# Patient Record
Sex: Female | Born: 1955 | Race: White | Hispanic: No | Marital: Married | Smoking: Never smoker
Health system: Southern US, Community
[De-identification: ages and names within clinical notes are randomized; demographics above are authoritative.]

## PROBLEM LIST (undated history)

## (undated) DIAGNOSIS — G35 Multiple sclerosis: Secondary | ICD-10-CM

## (undated) DIAGNOSIS — N289 Disorder of kidney and ureter, unspecified: Secondary | ICD-10-CM

## (undated) DIAGNOSIS — N200 Calculus of kidney: Secondary | ICD-10-CM

## (undated) HISTORY — PX: ABDOMINAL HYSTERECTOMY: SHX81

---

## 2008-09-06 ENCOUNTER — Encounter: Admission: RE | Admit: 2008-09-06 | Discharge: 2008-09-06 | Payer: Self-pay | Admitting: Unknown Physician Specialty

## 2011-08-26 ENCOUNTER — Inpatient Hospital Stay (INDEPENDENT_AMBULATORY_CARE_PROVIDER_SITE_OTHER)
Admission: RE | Admit: 2011-08-26 | Discharge: 2011-08-26 | Disposition: A | Discharge status: Home / Self Care | Payer: BC Managed Care – PPO | Source: Ambulatory Visit | Attending: Emergency Medicine | Admitting: Emergency Medicine

## 2011-08-26 ENCOUNTER — Encounter: Payer: Self-pay | Admitting: Emergency Medicine

## 2011-08-26 DIAGNOSIS — IMO0002 Reserved for concepts with insufficient information to code with codable children: Secondary | ICD-10-CM | POA: Insufficient documentation

## 2011-10-08 NOTE — Progress Notes (Signed)
Summary: shingles vaccine react./TM   Vital Signs:  Patient Profile:   55 Years Old Female CC:      REACTION TO SHINGLE VACCINE Height:     66 inches Weight:      175.50 pounds O2 Sat:      98 % O2 treatment:    Room Air Temp:     98.8 degrees F oral Pulse rate:   81 / minute Resp:     20 per minute BP sitting:   121 / 81  (right arm) Cuff size:   regular  Vitals Entered By: Linton Flemings RN (August 26, 2011 1:33 PM)                  Updated Prior Medication List: DOXYCYCLINE HYCLATE 100 MG CAPS (DOXYCYCLINE HYCLATE) 1 by mouth two times a day for 8 days  Current Allergies: ! CIPRO ! AUGMENTIN ! FLAGYLHistory of Present Illness Chief Complaint: REACTION TO SHINGLE VACCINE History of Present Illness: She had the shingles vaccine a few days ago. Last night developed redness and swelling on L arm where she got the shot.  Today feels fatigue, chills, body aches, flu like.  She feels the redness and swelling is getting worse.  REVIEW OF SYSTEMS Constitutional Symptoms       Complains of fever and chills.     Denies night sweats, weight loss, weight gain, and fatigue.  Eyes       Denies change in vision, eye pain, eye discharge, glasses, contact lenses, and eye surgery. Ear/Nose/Throat/Mouth       Denies hearing loss/aids, change in hearing, ear pain, ear discharge, dizziness, frequent runny nose, frequent nose bleeds, sinus problems, sore throat, hoarseness, and tooth pain or bleeding.  Respiratory       Denies dry cough, productive cough, wheezing, shortness of breath, asthma, bronchitis, and emphysema/COPD.  Cardiovascular       Denies murmurs, chest pain, and tires easily with exhertion.    Gastrointestinal       Complains of nausea/vomiting.      Denies stomach pain, diarrhea, constipation, blood in bowel movements, and indigestion. Genitourniary       Denies painful urination, kidney stones, and loss of urinary control. Neurological       Complains of headaches.       Denies paralysis, seizures, and fainting/blackouts. Musculoskeletal       Complains of muscle pain, redness, and swelling.      Denies joint pain, joint stiffness, decreased range of motion, muscle weakness, and gout.  Skin       Denies bruising, unusual mles/lumps or sores, and hair/skin or nail changes.  Psych       Denies mood changes, temper/anger issues, anxiety/stress, speech problems, depression, and sleep problems.  Past History:  Past Medical History: MS  Past Surgical History: Appendectomy Hysterectomy TUBAL PREGNANCY KNEE  Family History: LUNG CA- PARENTS MS- SISTER HTN- SISTER  Social History: SMOKE- NO ALCOHOL- NO DRUG USE- NO Physical Exam General appearance: well developed, well nourished, no acute distress MSE: oriented to time, place, and person L dorsal forearm has 6cm erythema and a central area of induration that is tender to palpation, no axillary LAD. Assessment New Problems: CELLULITIS, ARM (ICD-682.3)   Plan New Medications/Changes: DOXYCYCLINE HYCLATE 100 MG CAPS (DOXYCYCLINE HYCLATE) 1 by mouth two times a day for 8 days  #16 x 0, 08/26/2011, Hoyt Koch MD  New Orders: New Patient Level II [phone] Planning Comments:   Could just be a  localized reaction to the shot, however it does appear to be a cellulitis.  Will treat with Doxy for the next week.  Cool compresses to decrease inflammation today.  If not improving in a few days (or if getting worse in the meantime, she needs to call her PCP or go to ER).   The patient and/or caregiver has been counseled thoroughly with regard to medications prescribed including dosage, schedule, interactions, rationale for use, and possible side effects and they verbalize understanding.  Diagnoses and expected course of recovery discussed and will return if not improved as expected or if the condition worsens. Patient and/or caregiver verbalized understanding.  Prescriptions: DOXYCYCLINE HYCLATE  100 MG CAPS (DOXYCYCLINE HYCLATE) 1 by mouth two times a day for 8 days  #16 x 0   Entered and Authorized by:   Hoyt Koch MD   Signed by:   Hoyt Koch MD on 08/26/2011   Method used:   Print then Give to Patient   RxID:   [id number]   Orders Added: 1)  New Patient Level II [84132]

## 2011-12-13 ENCOUNTER — Other Ambulatory Visit: Payer: Self-pay | Admitting: Unknown Physician Specialty

## 2011-12-13 ENCOUNTER — Ambulatory Visit
Admission: RE | Admit: 2011-12-13 | Discharge: 2011-12-13 | Disposition: A | Discharge status: Home / Self Care | Payer: 59 | Source: Ambulatory Visit | Attending: Unknown Physician Specialty | Admitting: Unknown Physician Specialty

## 2011-12-13 DIAGNOSIS — R05 Cough: Secondary | ICD-10-CM

## 2012-04-07 ENCOUNTER — Ambulatory Visit
Admission: RE | Admit: 2012-04-07 | Discharge: 2012-04-07 | Disposition: A | Discharge status: Home / Self Care | Payer: 59 | Source: Ambulatory Visit | Attending: Family Medicine | Admitting: Family Medicine

## 2012-04-07 ENCOUNTER — Other Ambulatory Visit: Payer: Self-pay | Admitting: Family Medicine

## 2012-04-07 DIAGNOSIS — R52 Pain, unspecified: Secondary | ICD-10-CM

## 2014-04-04 ENCOUNTER — Encounter: Payer: Self-pay | Admitting: Emergency Medicine

## 2014-04-04 ENCOUNTER — Emergency Department
Admission: EM | Admit: 2014-04-04 | Discharge: 2014-04-04 | Disposition: A | Discharge status: Home / Self Care | Payer: 59 | Source: Home / Self Care | Attending: Emergency Medicine | Admitting: Emergency Medicine

## 2014-04-04 DIAGNOSIS — G35 Multiple sclerosis: Secondary | ICD-10-CM | POA: Insufficient documentation

## 2014-04-04 DIAGNOSIS — J069 Acute upper respiratory infection, unspecified: Secondary | ICD-10-CM

## 2014-04-04 HISTORY — DX: Multiple sclerosis: G35

## 2014-04-04 MED ORDER — AMOXICILLIN 875 MG PO TABS: 875.0000 mg | ORAL_TABLET | Freq: Two times a day (BID) | ORAL | Status: DC

## 2014-04-04 MED ORDER — FLUTICASONE PROPIONATE 50 MCG/ACT NA SUSP: 2.0000 | Freq: Every day | NASAL | Status: DC

## 2014-04-04 NOTE — ED Notes (Signed)
Deanna Daugherty complains of left side jaw pain, left ear pain and neck pain for 2 days. She states she is unable to lay down flat due to the pain. She rates the pain as a 9/10 on the pain scale. Denies fever, chills or sweats.

## 2014-04-04 NOTE — ED Provider Notes (Signed)
CSN: [id number]     Arrival date & time 04/04/14  1104 History   First MD Initiated Contact with Patient 04/04/14 1107     Chief Complaint  Patient presents with  . Jaw Pain  . Otalgia  . Neck Pain   (Consider location/radiation/quality/duration/timing/severity/associated sxs/prior Treatment) HPI Deanna Daugherty is a 58 y.o. female who complains of onset of jaw & face pain and URI symptoms for 2 days.  The symptoms are constant and moderate in severity.  Not using any meds.  Hurts worse in certain positions such as laying down. No sore throat No cough No pleuritic pain No wheezing No nasal congestion No post-nasal drainage No sinus pain/pressure No chest congestion No itchy/red eyes + L earache + L face & jaw pain No hemoptysis No SOB No chills/sweats No fever No nausea No vomiting No abdominal pain No diarrhea No skin rashes No fatigue No myalgias No headache     Past Medical History  Diagnosis Date  . MS (multiple sclerosis)    Past Surgical History  Procedure Laterality Date  . Abdominal hysterectomy     History reviewed. No pertinent family history. History  Substance Use Topics  . Smoking status: Never Smoker   . Smokeless tobacco: Never Used  . Alcohol Use: No   OB History   Grav Para Term Preterm Abortions TAB SAB Ect Mult Living                 Review of Systems  All other systems reviewed and are negative.   Allergies  Amoxicillin-pot clavulanate; Ceclor; Ciprofloxacin; and Metronidazole  Home Medications   Prior to Admission medications   Medication Sig Start Date End Date Taking? Authorizing Provider  acetaminophen (TYLENOL) 500 MG tablet Take 500 mg by mouth every 6 (six) hours as needed.   Yes Historical Provider, MD  amoxicillin (AMOXIL) 875 MG tablet Take 1 tablet (875 mg total) by mouth 2 (two) times daily. 04/04/14   Marlaine Hind, MD  fluticasone (FLONASE) 50 MCG/ACT nasal spray Place 2 sprays into both nostrils daily. 04/04/14    Marlaine Hind, MD   BP 117/76  Pulse 82  Ht 5\' 6"  (1.676 m)  Wt 187 lb (84.823 kg)  BMI 30.20 kg/m2  SpO2 98% Physical Exam  Nursing note and vitals reviewed. Constitutional: She is oriented to person, place, and time. She appears well-developed and well-nourished.  Non-toxic appearance. She does not appear ill.  HENT:  Head: Normocephalic and atraumatic.  Right Ear: Tympanic membrane, external ear and ear canal normal.  Left Ear: Tympanic membrane, external ear and ear canal normal.  Nose: Mucosal edema present.  Mouth/Throat: No oropharyngeal exudate, posterior oropharyngeal edema or posterior oropharyngeal erythema.  Mild left-sided submental and submandibular lymphadenopathy, mildly tender.  No parotid swelling.  No pain over the TMJ.  Mild tenderness of the left maxillary sinus  Eyes: No scleral icterus.  Neck: Neck supple.  Cardiovascular: Regular rhythm and normal heart sounds.   Pulmonary/Chest: Effort normal and breath sounds normal. No respiratory distress. She has no decreased breath sounds. She has no wheezes.  Neurological: She is alert and oriented to person, place, and time.  Skin: Skin is warm and dry.  Psychiatric: She has a normal mood and affect. Her speech is normal.    ED Course  Procedures (including critical care time) Labs Review Labs Reviewed - No data to display  Imaging Review No results found.   MDM   1. Acute upper respiratory infections of unspecified  site    1)  Take the prescribed antibiotic as instructed.  Patient likely with sinus infection.  However differential diagnosis also includes parotitis so if not improving, should try Clinda, etc to cover. Can try sour candies in the meantime.  Should get on scheduled Aleve or Advil to help with pain, especially before bed. 2)  Use nasal saline solution (over the counter) at least 3 times a day. 3)  Use over the counter decongestants like Zyrtec-D every 12 hours as needed to help with  congestion.  If you have hypertension, do not take medicines with sudafed.  4)  Can take tylenol every 6 hours or motrin every 8 hours for pain or fever. 5)  Follow up with your primary doctor if no improvement in 5-7 days, sooner if increasing pain, fever, or new symptoms.     Marlaine HindJeffrey H Darrion Wyszynski, MD 04/04/14 [phone]

## 2015-06-04 ENCOUNTER — Encounter: Payer: Self-pay | Admitting: Emergency Medicine

## 2015-06-04 ENCOUNTER — Emergency Department
Admission: EM | Admit: 2015-06-04 | Discharge: 2015-06-04 | Disposition: A | Discharge status: Home / Self Care | Payer: 59 | Source: Home / Self Care | Attending: Family Medicine | Admitting: Family Medicine

## 2015-06-04 ENCOUNTER — Emergency Department (INDEPENDENT_AMBULATORY_CARE_PROVIDER_SITE_OTHER): Payer: 59

## 2015-06-04 DIAGNOSIS — Z9109 Other allergy status, other than to drugs and biological substances: Secondary | ICD-10-CM

## 2015-06-04 DIAGNOSIS — Z91048 Other nonmedicinal substance allergy status: Secondary | ICD-10-CM

## 2015-06-04 DIAGNOSIS — R062 Wheezing: Secondary | ICD-10-CM | POA: Diagnosis not present

## 2015-06-04 DIAGNOSIS — R05 Cough: Secondary | ICD-10-CM

## 2015-06-04 DIAGNOSIS — J209 Acute bronchitis, unspecified: Secondary | ICD-10-CM

## 2015-06-04 MED ORDER — BENZONATATE 100 MG PO CAPS: 100.0000 mg | ORAL_CAPSULE | Freq: Three times a day (TID) | ORAL | Status: DC

## 2015-06-04 MED ORDER — AZITHROMYCIN 250 MG PO TABS: 250.0000 mg | ORAL_TABLET | Freq: Every day | ORAL | Status: DC

## 2015-06-04 MED ORDER — PREDNISONE 20 MG PO TABS: ORAL_TABLET | ORAL | Status: DC

## 2015-06-04 MED ORDER — DM-GUAIFENESIN ER 30-600 MG PO TB12: 1.0000 | ORAL_TABLET | Freq: Two times a day (BID) | ORAL | Status: DC

## 2015-06-04 NOTE — ED Provider Notes (Signed)
CSN: [id number]     Arrival date & time 06/04/15  1311 History   First MD Initiated Contact with Patient 06/04/15 1335     Chief Complaint  Patient presents with  . Cough   (Consider location/radiation/quality/duration/timing/severity/associated sxs/prior Treatment) HPI The patient is a 59 year old female with concern for possible pneumonia due to gradually worsening rattling in her chest with associated productive cough, mild intermittent shortness of breath and chest congestion for the last 3 days.  Patient states she gets recurrent sinus infections and stays congested but this is worse than her normal.  Patient states she did have some Augmentin left over from a sinus infection several months ago and started taking it, but is concerned if she does have an infection, she will need additional antibiotics.  She also reports mild diffuse body aches.  Patient states she believes she is allergic to her daughter's dog as it sheds a lot.  Patient states she has been taking Claritin with minimal relief.  She has used Flonase in the past but states she only gets minimal relief with this.  Patient states she does have family doctor but cannot afford a specialist such as a ENT or allergist.  Denies prior history of asthma.  Denies fevers, chills, nausea, vomiting or diarrhea.  Patient states she has had prednisone in the past for similar symptoms.  Patient does have an albuterol inhaler from prior chest congestion episodes.  Patient has never smoked cigarettes in the past.  Past Medical History  Diagnosis Date  . MS (multiple sclerosis)    Past Surgical History  Procedure Laterality Date  . Abdominal hysterectomy     No family history on file. History  Substance Use Topics  . Smoking status: Never Smoker   . Smokeless tobacco: Never Used  . Alcohol Use: No   OB History    No data available     Review of Systems  Constitutional: Negative for fever and chills.  HENT: Positive for congestion, ear  pain, rhinorrhea, sinus pressure, sneezing and sore throat. Negative for trouble swallowing and voice change.   Respiratory: Positive for cough, chest tightness, shortness of breath and wheezing. Negative for stridor.   Cardiovascular: Negative for chest pain and palpitations.  Gastrointestinal: Positive for nausea. Negative for vomiting, abdominal pain and diarrhea.  Genitourinary: Negative for dysuria, urgency, hematuria and flank pain.  Musculoskeletal: Positive for myalgias and arthralgias.  Neurological: Positive for dizziness. Negative for facial asymmetry, light-headedness and headaches.  All other systems reviewed and are negative.   Allergies  Amoxicillin-pot clavulanate; Ceclor; Ciprofloxacin; and Metronidazole  Home Medications   Prior to Admission medications   Medication Sig Start Date End Date Taking? Authorizing Provider  acetaminophen (TYLENOL) 500 MG tablet Take 500 mg by mouth every 6 (six) hours as needed.    Historical Provider, MD  amoxicillin (AMOXIL) 875 MG tablet Take 1 tablet (875 mg total) by mouth 2 (two) times daily. 04/04/14   Marlaine Hind, MD  azithromycin (ZITHROMAX) 250 MG tablet Take 1 tablet (250 mg total) by mouth daily. Take first 2 tablets together, then 1 every day until finished. 06/04/15   Junius Finner, PA-C  benzonatate (TESSALON) 100 MG capsule Take 1 capsule (100 mg total) by mouth every 8 (eight) hours. 06/04/15   Junius Finner, PA-C  dextromethorphan-guaiFENesin (MUCINEX DM) 30-600 MG per 12 hr tablet Take 1 tablet by mouth 2 (two) times daily. 06/04/15   Junius Finner, PA-C  fluticasone (FLONASE) 50 MCG/ACT nasal spray Place 2 sprays  into both nostrils daily. 04/04/14   Marlaine Hind, MD  predniSONE (DELTASONE) 20 MG tablet 3 tabs po day one, then 2 po daily x 4 days 06/04/15   Junius Finner, PA-C   BP 132/85 mmHg  Pulse 82  Temp(Src) 98.2 F (36.8 C) (Oral)  Ht 5\' 6"  (1.676 m)  Wt 180 lb (81.647 kg)  BMI 29.07 kg/m2  SpO2  98% Physical Exam  Constitutional: She appears well-developed and well-nourished. No distress.  HENT:  Head: Normocephalic and atraumatic.  Right Ear: External ear normal.  Left Ear: External ear normal.  Nose: Mucosal edema present.  Mouth/Throat: Uvula is midline, oropharynx is clear and moist and mucous membranes are normal. No oropharyngeal exudate.  Eyes: Conjunctivae are normal. No scleral icterus.  Neck: Normal range of motion. Neck supple.  Cardiovascular: Normal rate, regular rhythm and normal heart sounds.   Pulmonary/Chest: Effort normal. No respiratory distress. She has no wheezes. She has rales. She exhibits no tenderness.  Intermittent productive cough. No respiratory distress, able to speak in full sentences w/o difficulty.   Abdominal: Soft. Bowel sounds are normal. She exhibits no distension and no mass. There is no tenderness. There is no rebound and no guarding.  Musculoskeletal: Normal range of motion.  Neurological: She is alert.  Skin: Skin is warm and dry. She is not diaphoretic.  Nursing note and vitals reviewed.   ED Course  Procedures (including critical care time) Labs Review Labs Reviewed - No data to display  Imaging Review Dg Chest 2 View  06/04/2015   CLINICAL DATA:  Productive cough, wheezing, and chest congestion for 2 weeks. Sinusitis. Multiple sclerosis.  EXAM: CHEST  2 VIEW  COMPARISON:  12/13/2011  FINDINGS: The heart size and mediastinal contours are within normal limits. Both lungs are clear. No evidence of pneumothorax or pleural effusion.  IMPRESSION: Stable exam.  No active cardiopulmonary disease.   Electronically Signed   By: Myles Rosenthal M.D.   On: 06/04/2015 13:55     MDM   1. Acute bronchitis, unspecified organism   2. Environmental allergies    Patient is 59 year old female with history of recurrent sinus infections.  Patient is concerned for pneumonia.  Patient does have rhonchi and productive cough on exam.  Chest x-ray negative  for pneumonia, however due to severity of cough and complaint of intermittent shortness of breath, will start on 5 day course of prednisone and azithromycin.  Also prescribed Tessalon Perles and Mucinex for cough relief.  Strongly encouraged lots of rest and fluids.  Follow-up with PCP next week if symptoms not improving, sooner if worsening. Patient verbalized understanding and agreement with treatment plan.    Junius Finner, PA-C 06/04/15 1424

## 2015-06-04 NOTE — ED Notes (Signed)
Pt c/o possible pneumonia or lung infection that started on Wednesday.  Pt had left over Augmentin from a sinus infection that she started on Wednesday and is somewhat better.  Non-productive cough, some SOB, chest congestion.

## 2015-09-18 ENCOUNTER — Encounter: Payer: Self-pay | Admitting: Emergency Medicine

## 2015-09-18 ENCOUNTER — Emergency Department
Admission: EM | Admit: 2015-09-18 | Discharge: 2015-09-18 | Disposition: A | Discharge status: Home / Self Care | Payer: Medicare Other | Source: Home / Self Care | Attending: Family Medicine | Admitting: Family Medicine

## 2015-09-18 DIAGNOSIS — J069 Acute upper respiratory infection, unspecified: Secondary | ICD-10-CM

## 2015-09-18 DIAGNOSIS — B9789 Other viral agents as the cause of diseases classified elsewhere: Principal | ICD-10-CM

## 2015-09-18 MED ORDER — PREDNISONE 20 MG PO TABS: 20.0000 mg | ORAL_TABLET | Freq: Two times a day (BID) | ORAL | Status: DC

## 2015-09-18 MED ORDER — DOXYCYCLINE HYCLATE 100 MG PO CAPS: 100.0000 mg | ORAL_CAPSULE | Freq: Two times a day (BID) | ORAL | Status: DC

## 2015-09-18 MED ORDER — BENZONATATE 200 MG PO CAPS: 200.0000 mg | ORAL_CAPSULE | Freq: Every day | ORAL | Status: DC

## 2015-09-18 NOTE — ED Provider Notes (Signed)
CSN: [id number]     Arrival date & time 09/18/15  1143 History   First MD Initiated Contact with Patient 09/18/15 1146     Chief Complaint  Patient presents with  . Cough  . Hoarse  . Generalized Body Aches      HPI Comments: Patient complains of four day history of typical cold-like symptoms including sinus congestion, headache, fatigue, and cough.   The history is provided by the patient.    Past Medical History  Diagnosis Date  . MS (multiple sclerosis) [facility])    Past Surgical History  Procedure Laterality Date  . Abdominal hysterectomy     History reviewed. No pertinent family history. Social History  Substance Use Topics  . Smoking status: Never Smoker   . Smokeless tobacco: Never Used  . Alcohol Use: No   OB History    No data available     Review of Systems No sore throat + hoarse + cough No pleuritic pain No wheezing + nasal congestion ? post-nasal drainage No sinus pain/pressure No itchy/red eyes No earache No hemoptysis No SOB No fever/chills No nausea No vomiting No abdominal pain No diarrhea No urinary symptoms No skin rash + fatigue + myalgias + headache Used OTC meds without relief  Allergies  Amoxicillin-pot clavulanate; Ceclor; Ciprofloxacin; and Metronidazole  Home Medications   Prior to Admission medications   Medication Sig Start Date End Date Taking? Authorizing Provider  acetaminophen (TYLENOL) 500 MG tablet Take 500 mg by mouth every 6 (six) hours as needed.    Historical Provider, MD  benzonatate (TESSALON) 200 MG capsule Take 1 capsule (200 mg total) by mouth at bedtime. Take as needed for cough 09/18/15   Lattie Haw, MD  doxycycline (VIBRAMYCIN) 100 MG capsule Take 1 capsule (100 mg total) by mouth 2 (two) times daily. Take with food. 09/18/15   Lattie Haw, MD  fluticasone (FLONASE) 50 MCG/ACT nasal spray Place 2 sprays into both nostrils daily. 04/04/14   Marlaine Hind, MD  predniSONE (DELTASONE) 20 MG tablet  Take 1 tablet (20 mg total) by mouth 2 (two) times daily. Take with food. 09/18/15   Lattie Haw, MD   Meds Ordered and Administered this Visit  Medications - No data to display  BP 113/72 mmHg  Pulse 88  Temp(Src) 99.3 F (37.4 C) (Oral)  Resp 16  Ht  (1.676 m)  Wt 170 lb (77.111 kg)  BMI 27.45 kg/m2  SpO2 96% No data found.   Physical Exam Nursing notes and Vital Signs reviewed. Appearance:  Patient appears stated age, and in no acute distress Eyes:  Pupils are equal, round, and reactive to light and accomodation.  Extraocular movement is intact.  Conjunctivae are not inflamed  Ears:  Canals normal.  Tympanic membranes normal.  Nose:  Mildly congested turbinates.  No sinus tenderness.   Pharynx:  Normal Neck:  Supple.   Tender enlarged posterior nodes are palpated bilaterally  Lungs:   Scattered rhonchi.  Breath sounds are equal.  Moving air well. Heart:  Regular rate and rhythm without murmurs, rubs, or gallops.  Abdomen:  Nontender without masses or hepatosplenomegaly.  Bowel sounds are present.  No CVA or flank tenderness.  Extremities:  No edema.  No calf tenderness Skin:  No rash present.   ED Course  Procedures  none  MDM   1. Viral URI with cough    Begin doxycycline for atypical coverage.  Prednisone burst.  Prescription written for Benzonatate (Tessalon)  to take at bedtime for night-time cough.  Take plain guaifenesin (  extended release tabs such as Mucinex) twice daily, with plenty of water, for cough and congestion.  May add Pseudoephedrine ( , one or two every 4 to 6 hours) for sinus congestion.  Get adequate rest.   May use Afrin nasal spray (or generic oxymetazoline) twice daily for about 5 days and then discontinue.  Also recommend using saline nasal spray several times daily and saline nasal irrigation (AYR is a common brand).   Try warm salt water gargles for sore throat.  Stop all antihistamines for now, and other non-prescription  cough/cold preparations. Continue albuterol inhaler as needed.  Follow-up with family doctor if not improving about 7 to 10 days.     Lattie Haw, MD 09/24/15 [phone]

## 2015-09-18 NOTE — Discharge Instructions (Signed)
Take plain guaifenesin (  extended release tabs such as Mucinex) twice daily, with plenty of water, for cough and congestion.  May add Pseudoephedrine ( , one or two every 4 to 6 hours) for sinus congestion.  Get adequate rest.   May use Afrin nasal spray (or generic oxymetazoline) twice daily for about 5 days and then discontinue.  Also recommend using saline nasal spray several times daily and saline nasal irrigation (AYR is a common brand).   Try warm salt water gargles for sore throat.  Stop all antihistamines for now, and other non-prescription cough/cold preparations. Continue albuterol inhaler as needed.  Follow-up with family doctor if not improving about 7 to 10 days.

## 2015-09-18 NOTE — ED Notes (Signed)
Reports 4 days of cough that produces greenish sputum, congestion, ache. Took ibuprofen at 0800 today.

## 2016-01-21 ENCOUNTER — Encounter: Payer: Self-pay | Admitting: Emergency Medicine

## 2016-01-21 ENCOUNTER — Emergency Department
Admission: EM | Admit: 2016-01-21 | Discharge: 2016-01-21 | Disposition: A | Discharge status: Home / Self Care | Payer: Medicare Other | Source: Home / Self Care | Attending: Family Medicine | Admitting: Family Medicine

## 2016-01-21 ENCOUNTER — Emergency Department (INDEPENDENT_AMBULATORY_CARE_PROVIDER_SITE_OTHER): Payer: Medicare Other

## 2016-01-21 DIAGNOSIS — R0602 Shortness of breath: Secondary | ICD-10-CM | POA: Diagnosis not present

## 2016-01-21 DIAGNOSIS — J111 Influenza due to unidentified influenza virus with other respiratory manifestations: Secondary | ICD-10-CM

## 2016-01-21 DIAGNOSIS — R69 Illness, unspecified: Principal | ICD-10-CM

## 2016-01-21 MED ORDER — OSELTAMIVIR PHOSPHATE 75 MG PO CAPS: 75.0000 mg | ORAL_CAPSULE | Freq: Two times a day (BID) | ORAL | Status: DC

## 2016-01-21 MED ORDER — IBUPROFEN 800 MG PO TABS
800.0000 mg | ORAL_TABLET | Freq: Once | ORAL | Status: AC
  Administered 2016-01-21: 800 mg via ORAL

## 2016-01-21 MED ORDER — PREDNISONE 20 MG PO TABS: 20.0000 mg | ORAL_TABLET | Freq: Two times a day (BID) | ORAL | Status: DC

## 2016-01-21 MED ORDER — BENZONATATE 200 MG PO CAPS: 200.0000 mg | ORAL_CAPSULE | Freq: Every day | ORAL | Status: DC

## 2016-01-21 NOTE — ED Notes (Signed)
Reports few days of nasal congestion; yesterday began running fever, had aches and chills, took mucinex with tylenol 3 hours ago.

## 2016-01-21 NOTE — Discharge Instructions (Signed)
Take plain guaifenesin (  extended release tabs such as Mucinex) twice daily, with plenty of water, for cough and congestion.  May add Pseudoephedrine ( , one or two every 4 to 6 hours) for sinus congestion.  Get adequate rest.   May use Afrin nasal spray (or generic oxymetazoline) twice daily for about 5 days and then discontinue.  Also recommend using saline nasal spray several times daily and saline nasal irrigation (AYR is a common brand).  Try warm salt water gargles for sore throat.  Stop all antihistamines for now, and other non-prescription cough/cold preparations. Continue inhaler as needed. Follow-up with family doctor if not improving about 6 days.Marland Kitchen

## 2016-01-21 NOTE — ED Provider Notes (Signed)
CSN: [id number]     Arrival date & time 01/21/16  1506 History   First MD Initiated Contact with Patient 01/21/16 1701     Chief Complaint  Patient presents with  . Cough  . Fever  . Generalized Body Aches  . Shortness of Breath      HPI Comments: Patient reports that she had mild nasal congestion for about a week but did not feel ill.   Yesterday she suddenly developed fever to 100.5, and today had increased cough, headache, myalgias, and increased sinus congestion. She has a past history of exercise asthma, and has had pneumonia in the past.  The history is provided by the patient.    Past Medical History  Diagnosis Date  . MS (multiple sclerosis) [facility])    Past Surgical History  Procedure Laterality Date  . Abdominal hysterectomy     History reviewed. No pertinent family history. Social History  Substance Use Topics  . Smoking status: Never Smoker   . Smokeless tobacco: Never Used  . Alcohol Use: No   OB History    No data available     Review of Systems + sore throat + cough + sneezing No pleuritic pain + wheezing + nasal congestion + post-nasal drainage No sinus pain/pressure No itchy/red eyes ? earache No hemoptysis + SOB + fever, + chills No nausea No vomiting No abdominal pain No diarrhea No urinary symptoms No skin rash + fatigue + myalgias + headache Used OTC meds without relief  Allergies  Amoxicillin-pot clavulanate; Ceclor; Ciprofloxacin; and Metronidazole  Home Medications   Prior to Admission medications   Medication Sig Start Date End Date Taking? Authorizing Provider  acetaminophen (TYLENOL) 500 MG tablet Take 500 mg by mouth every 6 (six) hours as needed.    Historical Provider, MD  benzonatate (TESSALON) 200 MG capsule Take 1 capsule (200 mg total) by mouth at bedtime. Take as needed for cough 01/21/16   Lattie Haw, MD  doxycycline (VIBRAMYCIN) 100 MG capsule Take 1 capsule (100 mg total) by mouth 2 (two) times daily. Take  with food. 09/18/15   Lattie Haw, MD  fluticasone (FLONASE) 50 MCG/ACT nasal spray Place 2 sprays into both nostrils daily. 04/04/14   Marlaine Hind, MD  oseltamivir (TAMIFLU) 75 MG capsule Take 1 capsule (75 mg total) by mouth every 12 (twelve) hours. 01/21/16   Lattie Haw, MD  predniSONE (DELTASONE) 20 MG tablet Take 1 tablet (20 mg total) by mouth 2 (two) times daily. Take with food. 01/21/16   Lattie Haw, MD   Meds Ordered and Administered this Visit   Medications  ibuprofen (ADVIL,MOTRIN) tablet 800 mg (800 mg Oral Given 01/21/16 1612)    BP 104/69 mmHg  Pulse 102  Temp(Src) 101.5 F (38.6 C) (Oral)  Resp 18  Ht 5\' 6"  (1.676 m)  Wt 178 lb (80.74 kg)  BMI 28.74 kg/m2  SpO2 96% No data found.   Physical Exam Nursing notes and Vital Signs reviewed. Appearance:  Patient appears stated age, and in no acute distress Eyes:  Pupils are equal, round, and reactive to light and accomodation.  Extraocular movement is intact.  Conjunctivae are not inflamed  Ears:  Canals normal.  Tympanic membranes normal.  Nose: Congested turbinates.  No sinus tenderness.   Pharynx:  Erythematous uvula  Neck:  Supple.  Tender enlarged posterior nodes are palpated bilaterally  Lungs:  Clear to auscultation.  Breath sounds are equal.  Moving air well. Heart:  Regular  rate and rhythm without murmurs, rubs, or gallops.  Abdomen:  Nontender without masses or hepatosplenomegaly.  Bowel sounds are present.  No CVA or flank tenderness.  Extremities:  No edema.  Skin:  No rash present.   ED Course  Procedures none  Imaging Review Dg Chest 2 View  01/21/2016  CLINICAL DATA:  Congestion for 1 week.  Shortness of breath. EXAM: CHEST  2 VIEW COMPARISON:  June 04, 2015 FINDINGS: The heart size and mediastinal contours are within normal limits. Both lungs are clear. The visualized skeletal structures are unremarkable. IMPRESSION: No active cardiopulmonary disease. Electronically Signed   By: Gerome Sam III M.D   On: 01/21/2016 17:39      MDM   1. Influenza-like illness    Begin Tamiflu, and prednisone burst. Prescription written for Benzonatate (Tessalon) to take at bedtime for night-time cough.  Take plain guaifenesin (  extended release tabs such as Mucinex) twice daily, with plenty of water, for cough and congestion.  May add Pseudoephedrine ( , one or two every 4 to 6 hours) for sinus congestion.  Get adequate rest.   May use Afrin nasal spray (or generic oxymetazoline) twice daily for about 5 days and then discontinue.  Also recommend using saline nasal spray several times daily and saline nasal irrigation (AYR is a common brand).  Try warm salt water gargles for sore throat.  Stop all antihistamines for now, and other non-prescription cough/cold preparations. Continue inhaler as needed. Follow-up with family doctor if not improving about 6 days.Lattie Haw, MD 01/24/16 2204

## 2017-11-11 IMAGING — CR DG CHEST 2V
2 series · 2 of 2 positions shown · non-contrast
Comparison: June 04, 2015

CLINICAL DATA: Congestion for 1 week.  Shortness of breath.

EXAM:
CHEST  2 VIEW

[chest pa]
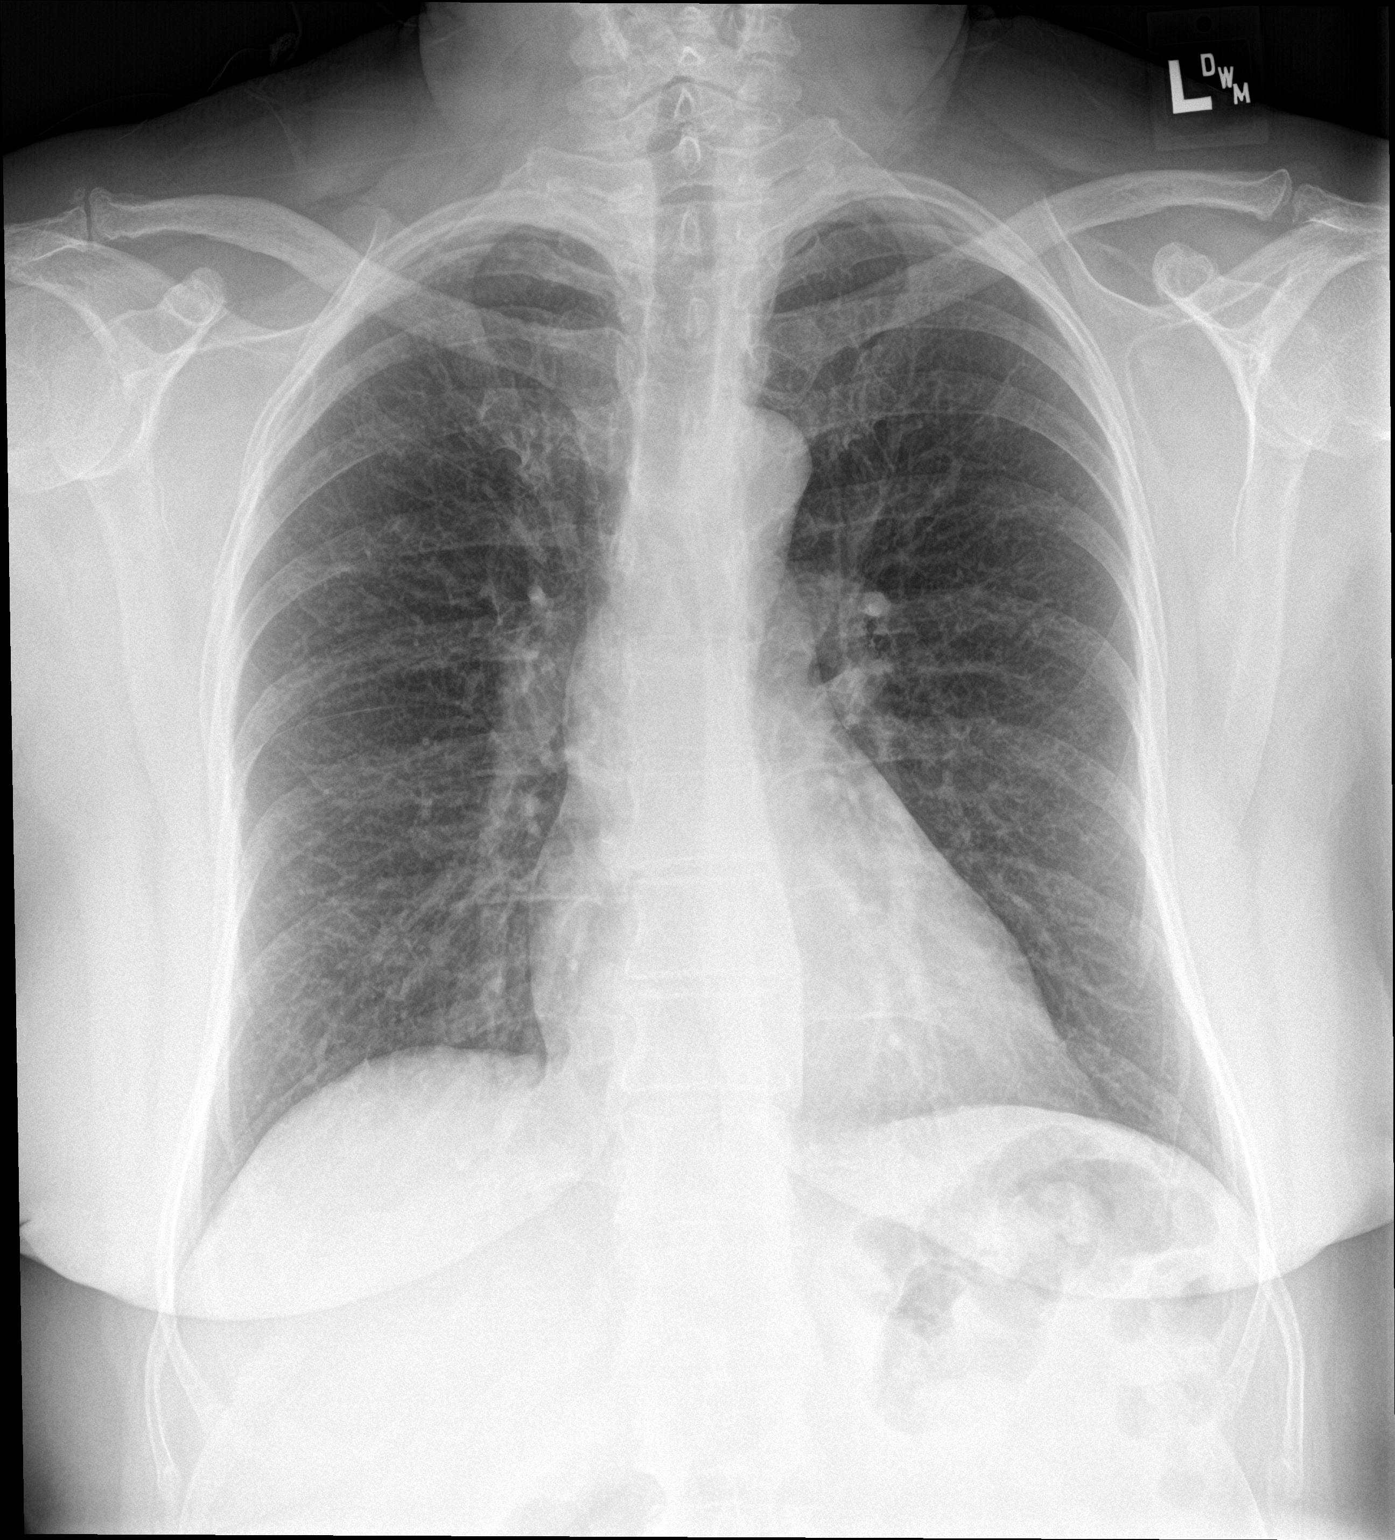

[chest lat]
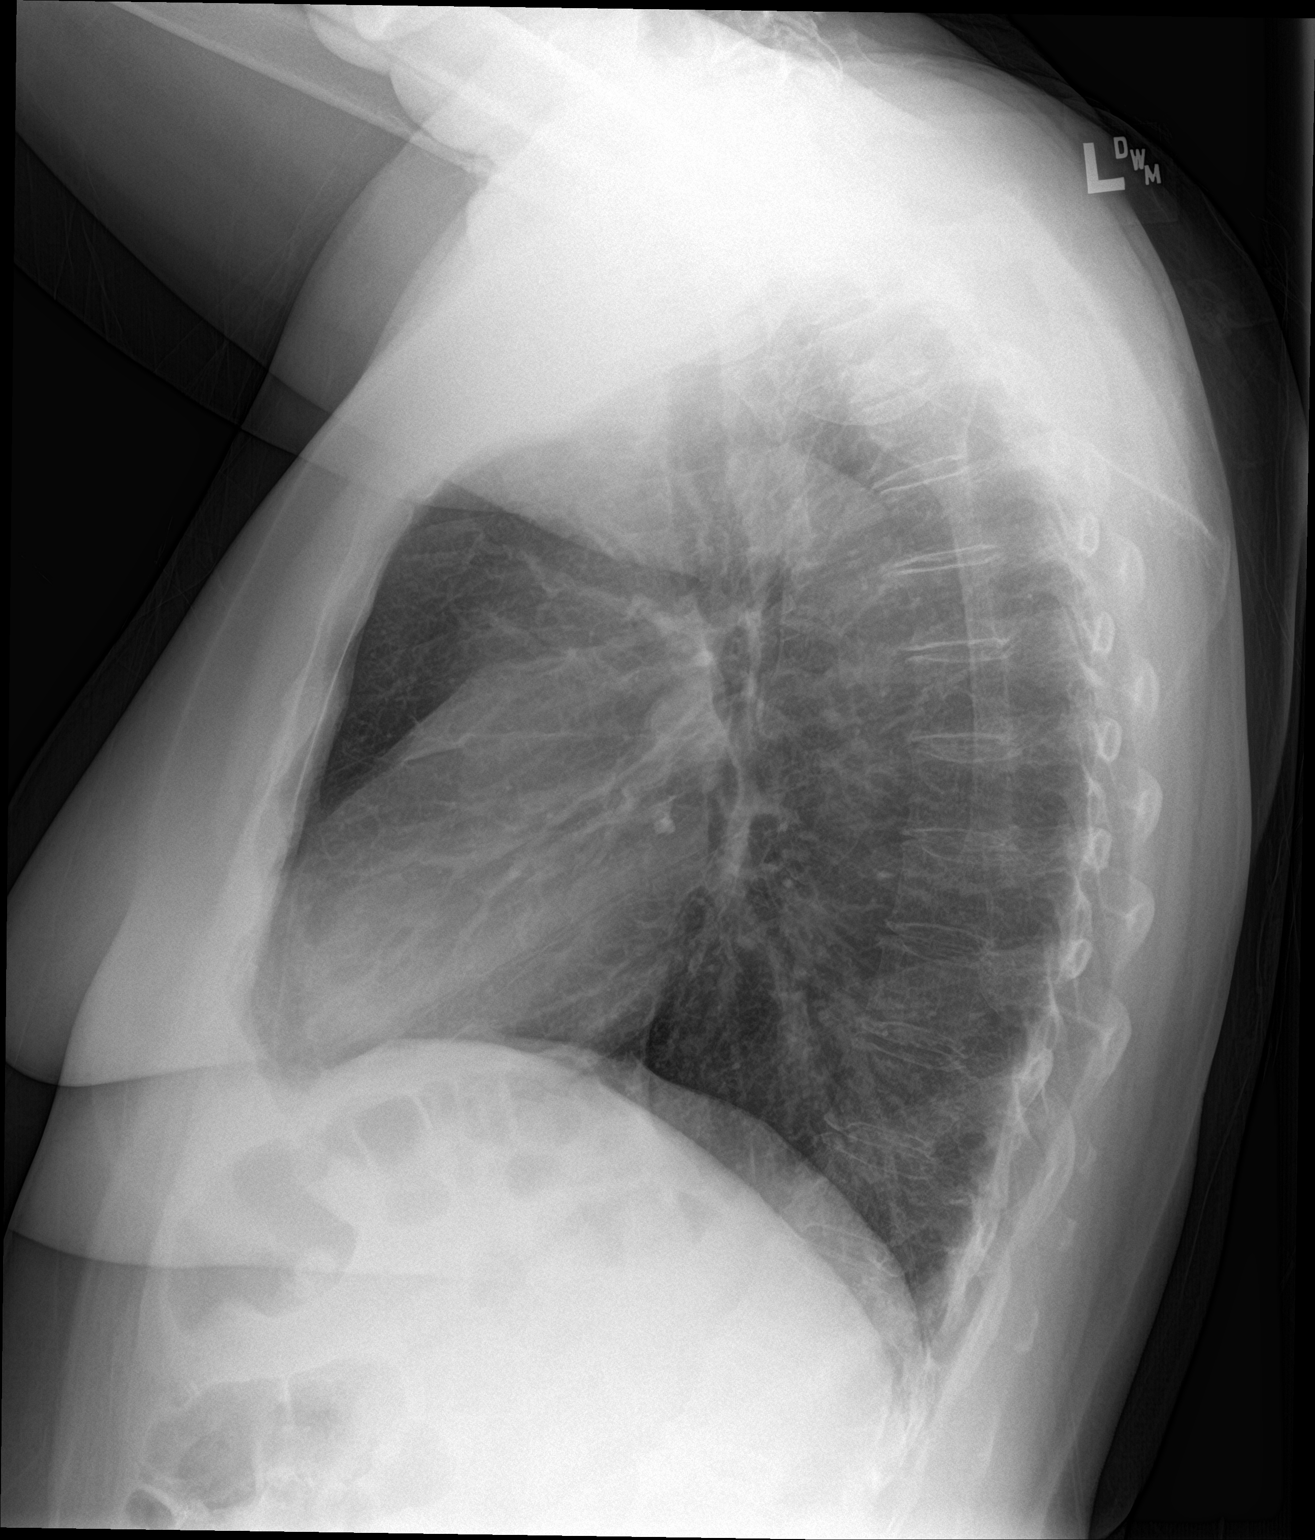

[2 of 2 positions shown; findings below may reference images not displayed]

FINDINGS: The heart size and mediastinal contours are within normal limits.
Both lungs are clear. The visualized skeletal structures are
unremarkable.
IMPRESSION: No active cardiopulmonary disease.

## 2018-05-18 ENCOUNTER — Encounter: Payer: Self-pay | Admitting: Emergency Medicine

## 2018-05-18 ENCOUNTER — Emergency Department
Admission: EM | Admit: 2018-05-18 | Discharge: 2018-05-18 | Disposition: A | Discharge status: Home / Self Care | Payer: Medicare Other | Source: Home / Self Care

## 2018-05-18 DIAGNOSIS — J0120 Acute ethmoidal sinusitis, unspecified: Secondary | ICD-10-CM

## 2018-05-18 MED ORDER — DOXYCYCLINE HYCLATE 100 MG PO CAPS
100.0000 mg | ORAL_CAPSULE | Freq: Two times a day (BID) | ORAL | 0 refills | Status: DC
Start: 2018-05-18 — End: 2020-07-30

## 2018-05-18 NOTE — Discharge Instructions (Signed)
Return if any problems.

## 2018-05-18 NOTE — ED Triage Notes (Signed)
Patient presents to [facility] with C/O nasal congestion, headache, bilateral ear pain cough productive at times, greenish /yellow. Symptoms times 7 days

## 2018-05-19 NOTE — ED Provider Notes (Signed)
[facility]    CSN: [id number] Arrival date & time: 05/18/18  1103     History   Chief Complaint Chief Complaint  Patient presents with  . Facial Pain  . Nasal Congestion  . Cough  . Otalgia    HPI Deanna Daugherty is a 62 y.o. female.   The history is provided by the patient. No language interpreter was used.  Cough  Cough characteristics:  Productive Sputum characteristics:  Nondescript Severity:  Moderate Onset quality:  Gradual Timing:  Constant Progression:  Worsening Chronicity:  Recurrent Smoker: no   Relieved by:  Nothing Worsened by:  Nothing Ineffective treatments:  None tried Associated symptoms: ear pain   Otalgia  Associated symptoms: cough   Pt complains of sinus pain and pressure.  Past Medical History:  Diagnosis Date  . MS (multiple sclerosis) [facility])     Patient Active Problem List   Diagnosis Date Noted  . MS (multiple sclerosis) (HCC)   . CELLULITIS, ARM 08/26/2011    Past Surgical History:  Procedure Laterality Date  . ABDOMINAL HYSTERECTOMY      OB History   None      Home Medications    Prior to Admission medications   Medication Sig Start Date End Date Taking? Authorizing Provider  acetaminophen (TYLENOL) 500 MG tablet Take 500 mg by mouth every 6 (six) hours as needed.    [provider]  doxycycline (VIBRAMYCIN) 100 MG capsule Take 1 capsule (100 mg total) by mouth 2 (two) times daily. 05/18/18   Elson Areas, PA-C    Family History History reviewed. No pertinent family history.  Social History Social History   Tobacco Use  . Smoking status: Never Smoker  . Smokeless tobacco: Never Used  Substance Use Topics  . Alcohol use: No  . Drug use: No     Allergies   Amoxicillin-pot clavulanate; Ceclor [cefaclor]; Ciprofloxacin; and Metronidazole   Review of Systems Review of Systems  HENT: Positive for ear pain.   Respiratory: Positive for cough.   All other systems reviewed and are  negative.    Physical Exam Triage Vital Signs ED Triage Vitals  Enc Vitals Group     BP 05/18/18 1133 114/77     Pulse Rate 05/18/18 1133 84     Resp 05/18/18 1133 16     Temp 05/18/18 1133 98.6 F (37 C)     Temp Source 05/18/18 1133 Oral     SpO2 05/18/18 1133 98 %     Weight 05/18/18 1134 185 lb 4 oz (84 kg)     Height 05/18/18 1134 5\' 6"  (1.676 m)     Head Circumference --      Peak Flow --      Pain Score 05/18/18 1134 (S) 5     Pain Loc --      Pain Edu? --      Excl. in GC? --    No data found.  Updated Vital Signs BP 114/77 (BP Location: Right Arm)   Pulse 84   Temp 98.6 F (37 C) (Oral)   Resp 16   Ht 5\' 6"  (1.676 m)   Wt 185 lb 4 oz (84 kg)   SpO2 98%   BMI 29.90 kg/m   Visual Acuity Right Eye Distance:   Left Eye Distance:   Bilateral Distance:    Right Eye Near:   Left Eye Near:    Bilateral Near:     Physical Exam  Constitutional: She is  oriented to person, place, and time. She appears well-developed and well-nourished.  HENT:  Head: Normocephalic.  Right Ear: External ear normal.  Left Ear: External ear normal.  Tender maxillary sinuses,   Eyes: EOM are normal.  Neck: Normal range of motion.  Cardiovascular: Normal rate and regular rhythm.  Pulmonary/Chest: Effort normal.  Abdominal: She exhibits no distension.  Musculoskeletal: Normal range of motion.  Neurological: She is alert and oriented to person, place, and time.  Psychiatric: She has a normal mood and affect.  Nursing note and vitals reviewed.    UC Treatments / Results  Labs (all labs ordered are listed, but only abnormal results are displayed) Labs Reviewed - No data to display  EKG None  Radiology No results found.  Procedures Procedures (including critical care time)  Medications Ordered in UC Medications - No data to display  Initial Impression / Assessment and Plan / UC Course  I have reviewed the triage vital signs and the nursing notes.  Pertinent labs  & imaging results that were available during my care of the patient were reviewed by me and considered in my medical decision making (see chart for details).     MDM  Pt started on antibiotics.  Pt advised to follow up with her MD for recheck  Final Clinical Impressions(s) / UC Diagnoses   Final diagnoses:  Acute ethmoidal sinusitis, recurrence not specified     Discharge Instructions     Return if any problems.    ED Prescriptions    Medication Sig Dispense Auth. Provider   doxycycline (VIBRAMYCIN) 100 MG capsule Take 1 capsule (100 mg total) by mouth 2 (two) times daily. 20 capsule Elson Areas, [place]     Controlled Substance Prescriptions [place] Controlled Substance Registry consulted? Not Applicable  An After Visit Summary was printed and given to the patient.    Elson Areas, [place] 05/19/18 [phone]

## 2020-07-30 ENCOUNTER — Other Ambulatory Visit: Payer: Self-pay

## 2020-07-30 ENCOUNTER — Emergency Department
Admission: EM | Admit: 2020-07-30 | Discharge: 2020-07-30 | Disposition: A | Discharge status: Home / Self Care | Payer: Medicare Other | Source: Home / Self Care | Attending: Family Medicine | Admitting: Family Medicine

## 2020-07-30 ENCOUNTER — Emergency Department (INDEPENDENT_AMBULATORY_CARE_PROVIDER_SITE_OTHER): Payer: Medicare Other

## 2020-07-30 DIAGNOSIS — J329 Chronic sinusitis, unspecified: Secondary | ICD-10-CM

## 2020-07-30 DIAGNOSIS — J0101 Acute recurrent maxillary sinusitis: Secondary | ICD-10-CM

## 2020-07-30 DIAGNOSIS — J0111 Acute recurrent frontal sinusitis: Secondary | ICD-10-CM

## 2020-07-30 MED ORDER — PREDNISONE 20 MG PO TABS: ORAL_TABLET | ORAL | 0 refills | Status: DC

## 2020-07-30 MED ORDER — AMOXICILLIN-POT CLAVULANATE 875-125 MG PO TABS: ORAL_TABLET | ORAL | 0 refills | Status: DC

## 2020-07-30 NOTE — Discharge Instructions (Addendum)
Take Pseudoephedrine (30mg , one or two every 4 to 6 hours) for sinus congestion.  Increase fluid intake. May use Afrin nasal spray (or generic oxymetazoline) each morning for about 5 days and then discontinue.  Also recommend using saline nasal spray several times daily and saline nasal irrigation (AYR is a common brand).  Use Flonase nasal spray each morning after using Afrin nasal spray and saline nasal irrigation. Try warm salt water gargles for sore throat.

## 2020-07-30 NOTE — ED Triage Notes (Signed)
x13 days. Pt states that she has some sinus drainage, ears feel full, cough from sinus drainage. Pt states that she always has sinus infections around this time of year. Pt states that she is having some yellow phlegm. Pt states that she has some facial pain as well. Pt states that she is vaccinated.

## 2020-07-30 NOTE — ED Provider Notes (Signed)
[facility]    CSN: [id number] Arrival date & time: 07/30/20  1056      History   Chief Complaint Chief Complaint  Patient presents with  . Facial Pain    HPI Deanna Daugherty is a 64 y.o. female.   Thirteen days ago patient developed sinus congestion that has persisted.  She has developed constant facial pain and ears feel full.  She has post-nasal drainage and a mild cough.  She denies fevers, chills, and sweats.  She states that she has a history of recurrent sinusitis this time of year.  The history is provided by the patient.    Past Medical History:  Diagnosis Date  . MS (multiple sclerosis) [facility])     Patient Active Problem List   Diagnosis Date Noted  . MS (multiple sclerosis) (HCC)   . CELLULITIS, ARM 08/26/2011    Past Surgical History:  Procedure Laterality Date  . ABDOMINAL HYSTERECTOMY      OB History   No obstetric history on file.      Home Medications    Prior to Admission medications   Medication Sig Start Date End Date Taking? Authorizing Provider  acetaminophen (TYLENOL) 500 MG tablet Take 500 mg by mouth every 6 (six) hours as needed.   Yes [provider]  aspirin 81 MG chewable tablet Chew by mouth.   Yes [provider]  amoxicillin-clavulanate (AUGMENTIN) 875-125 MG tablet Take one tab PO Q12hr 07/30/20   Lattie Haw, MD  predniSONE (DELTASONE) 20 MG tablet Take one tab by mouth twice daily for 4 days, then one daily for 3 days. Take with food. 07/30/20   Lattie Haw, MD    Family History Family History  Problem Relation Age of Onset  . Cancer Mother   . Cancer Father   . Hypertension Sister   . Heart defect Sister     Social History Social History   Tobacco Use  . Smoking status: Never Smoker  . Smokeless tobacco: Never Used  Substance Use Topics  . Alcohol use: No  . Drug use: No     Allergies   Ceclor [cefaclor], Ciprofloxacin, and Metronidazole   Review of Systems Review of  Systems No sore throat + cough No pleuritic pain No wheezing + nasal congestion + post-nasal drainage + sinus pain/pressure No itchy/red eyes ? earache No hemoptysis No SOB No fever/chills No nausea No vomiting No abdominal pain No diarrhea No urinary symptoms No skin rash + fatigue No myalgias + headache    Physical Exam Triage Vital Signs ED Triage Vitals  Enc Vitals Group     BP 07/30/20 1118 124/82     Pulse Rate 07/30/20 1118 76     Resp --      Temp 07/30/20 1118 99.1 F (37.3 C)     Temp Source 07/30/20 1118 Oral     SpO2 07/30/20 1118 97 %     Weight 07/30/20 1114 185 lb (83.9 kg)     Height 07/30/20 1114 5\' 5"  (1.651 m)     Head Circumference --      Peak Flow --      Pain Score 07/30/20 1113 8     Pain Loc --      Pain Edu? --      Excl. in GC? --    No data found.  Updated Vital Signs BP 124/82 (BP Location: Left Arm)   Pulse 76   Temp 99.1 F (37.3 C) (Oral)  Ht 5\' 5"  (1.651 m)   Wt 83.9 kg   SpO2 97%   BMI 30.79 kg/m   Visual Acuity Right Eye Distance:   Left Eye Distance:   Bilateral Distance:    Right Eye Near:   Left Eye Near:    Bilateral Near:     Physical Exam Nursing notes and Vital Signs reviewed. Appearance:  Patient appears stated age, and in no acute distress Eyes:  Pupils are equal, round, and reactive to light and accomodation.  Extraocular movement is intact.  Conjunctivae are not inflamed  Ears:  Canals normal.  Tympanic membranes normal.  Nose:  Congested turbinates.  Maxillary and frontal sinus tenderness is present.  Pharynx:  Normal Neck:  Supple.  No adenopathy.  Lungs:  Clear to auscultation.  Breath sounds are equal.  Moving air well. Heart:  Regular rate and rhythm without murmurs, rubs, or gallops.  Abdomen:  Nontender without masses or hepatosplenomegaly.  Bowel sounds are present.  No CVA or flank tenderness.  Extremities:  No edema.  Skin:  No rash present.   UC Treatments / Results  Labs (all  labs ordered are listed, but only abnormal results are displayed) Labs Reviewed - No data to display  EKG   Radiology DG Sinuses Complete  Result Date: 07/30/2020 CLINICAL DATA:  Chronic sinus congestion and recurrent sinusitis. EXAM: PARANASAL SINUSES - COMPLETE 3 + VIEW COMPARISON:  None. FINDINGS: The paranasal sinus are aerated. There is no evidence of sinus opacification air-fluid levels or mucosal thickening. No significant bone abnormalities are seen. IMPRESSION: Negative. Electronically Signed   By: 08/01/2020 M.D.   On: 07/30/2020 12:49    Procedures Procedures (including critical care time)  Medications Ordered in UC Medications - No data to display  Initial Impression / Assessment and Plan / UC Course  I have reviewed the triage vital signs and the nursing notes.  Pertinent labs & imaging results that were available during my care of the patient were reviewed by me and considered in my medical decision making (see chart for details).    Begin prednisone burst/taper and Augmentin. Followup with Family Doctor if not improved in about 10 days.   Final Clinical Impressions(s) / UC Diagnoses   Final diagnoses:  Acute recurrent maxillary sinusitis  Acute recurrent frontal sinusitis     Discharge Instructions     Take Pseudoephedrine (30mg , one or two every 4 to 6 hours) for sinus congestion.  Increase fluid intake. May use Afrin nasal spray (or generic oxymetazoline) each morning for about 5 days and then discontinue.  Also recommend using saline nasal spray several times daily and saline nasal irrigation (AYR is a common brand).  Use Flonase nasal spray each morning after using Afrin nasal spray and saline nasal irrigation. Try warm salt water gargles for sore throat.       ED Prescriptions    Medication Sig Dispense Auth. Provider   amoxicillin-clavulanate (AUGMENTIN) 875-125 MG tablet Take one tab PO Q12hr 20 tablet 08/01/2020, MD   predniSONE  (DELTASONE) 20 MG tablet Take one tab by mouth twice daily for 4 days, then one daily for 3 days. Take with food. 11 tablet , MD        Lattie Haw, MD 08/02/20 [phone]

## 2022-05-21 IMAGING — DX DG SINUSES COMPLETE 3+V
3 series · 3 of 3 positions shown · non-contrast
Comparison: None.

CLINICAL DATA: Chronic sinus congestion and recurrent sinusitis.

EXAM:
PARANASAL SINUSES - COMPLETE 3 + VIEW

[pns waters]
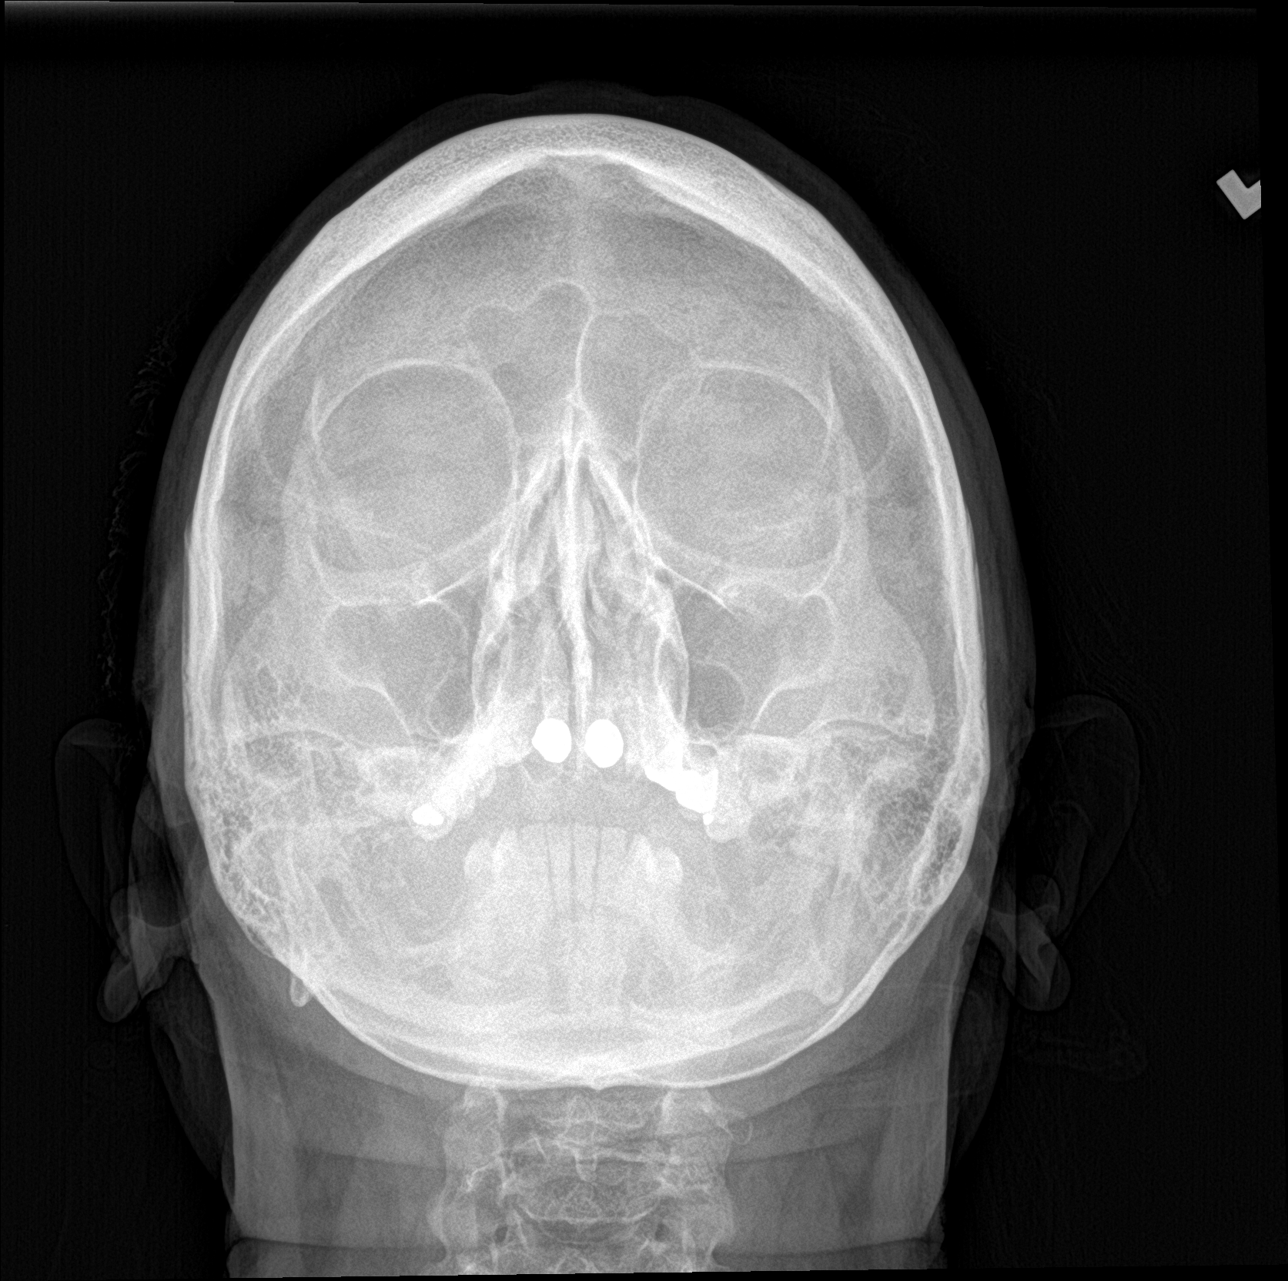

[[person_name]]
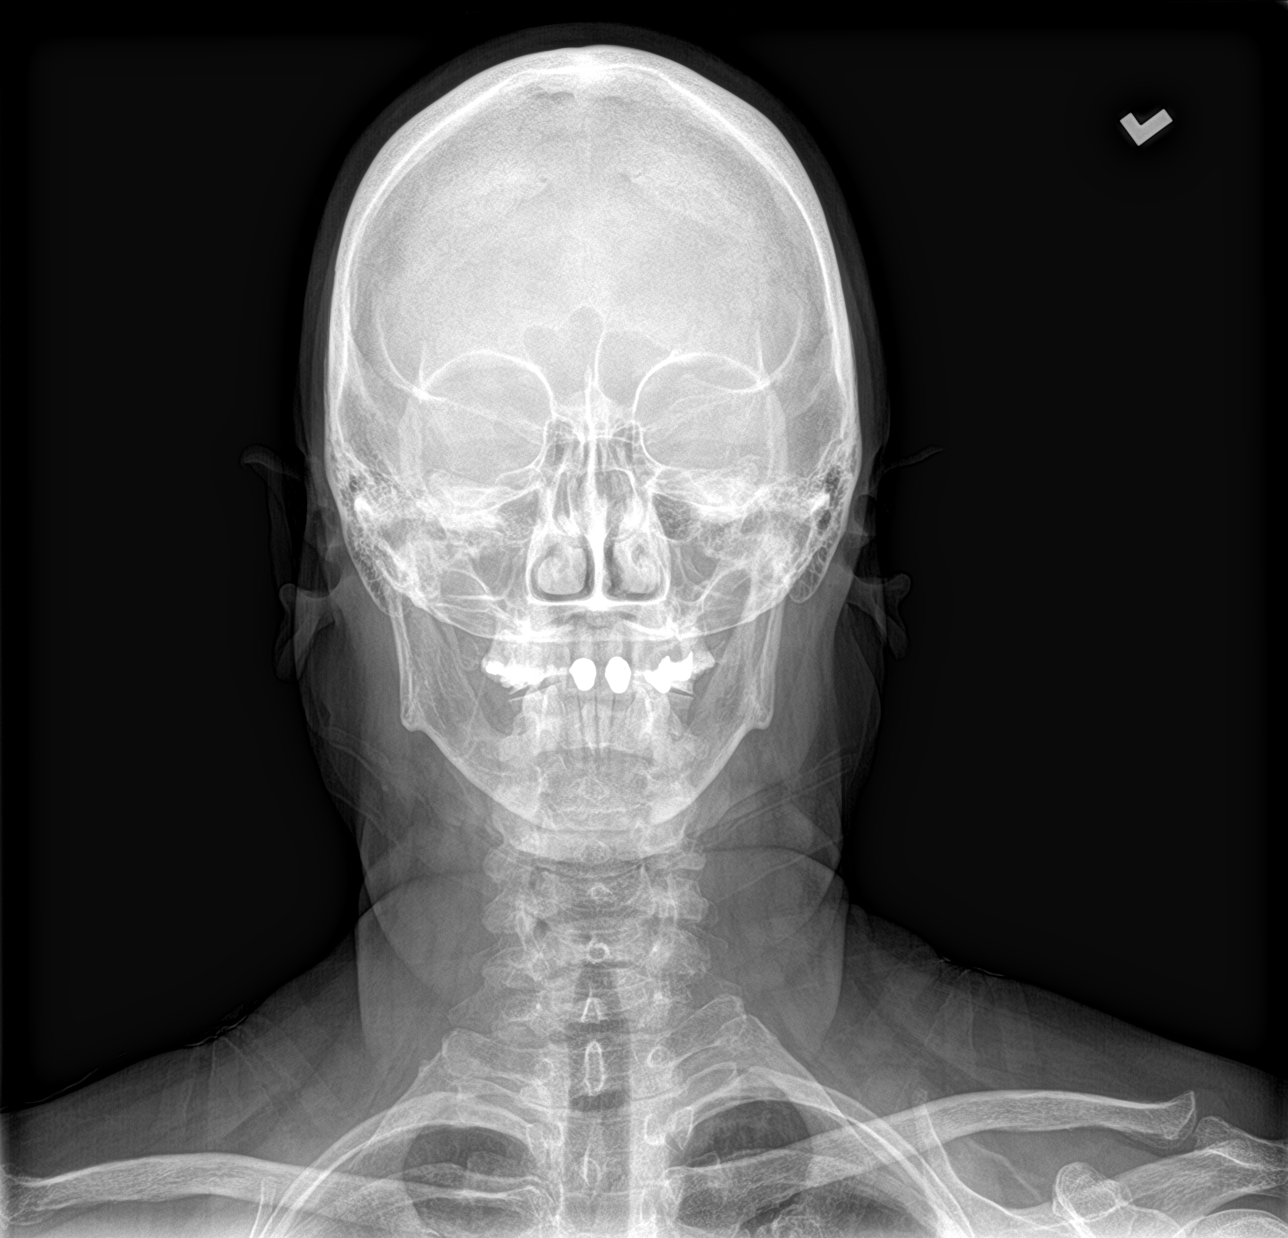

[pns lat]
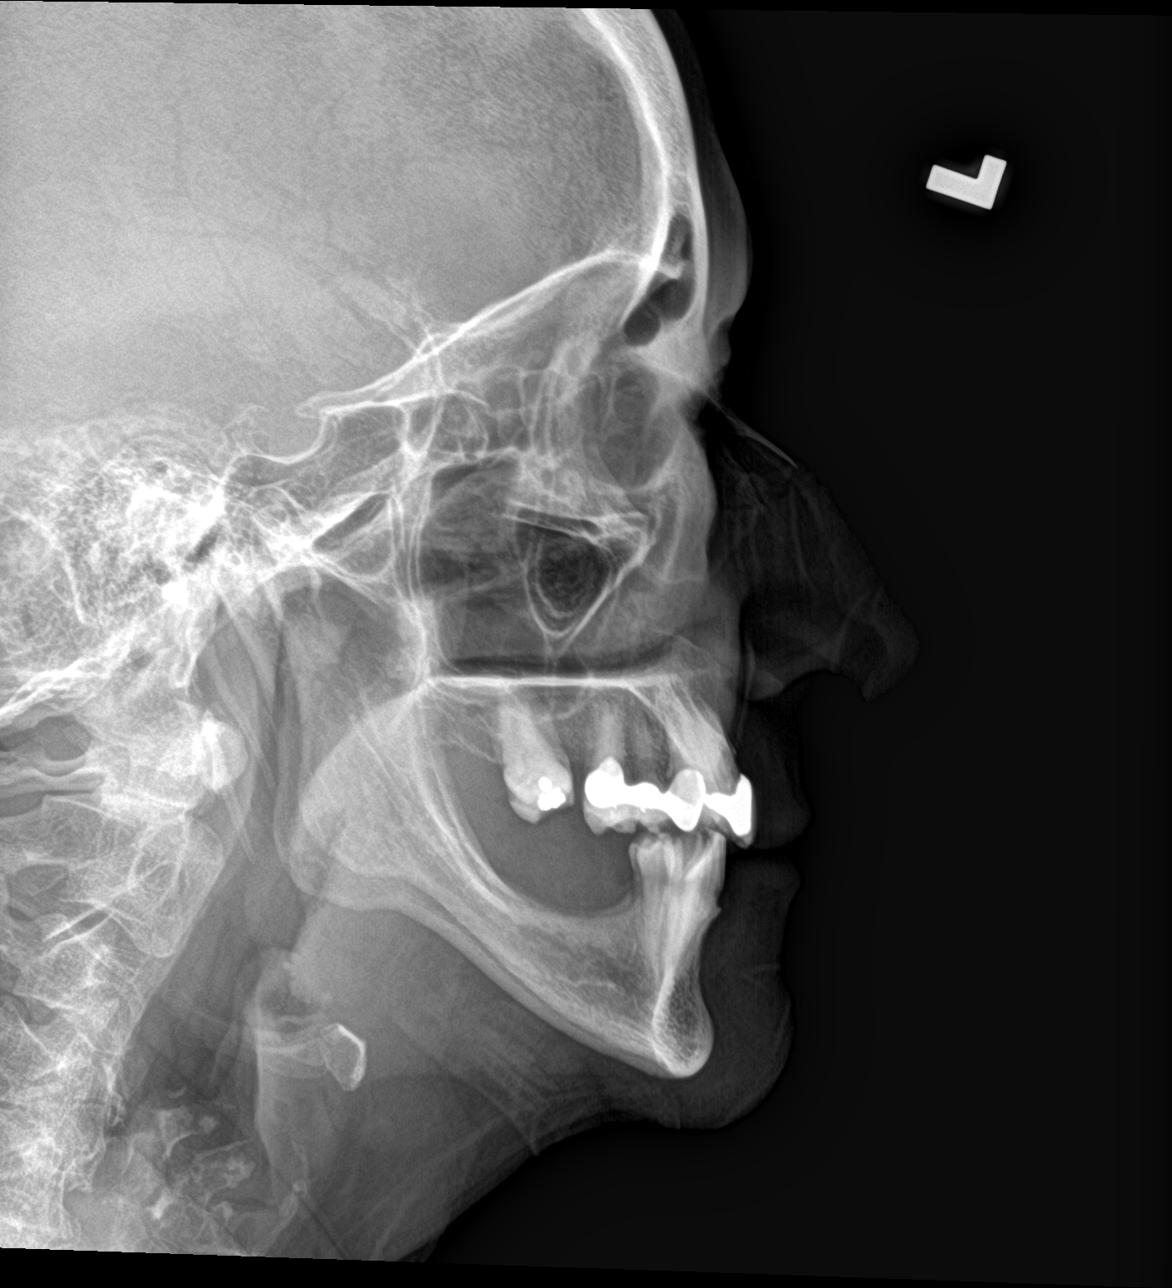

[3 of 3 positions shown; findings below may reference images not displayed]

FINDINGS: The paranasal sinus are aerated. There is no evidence of sinus
opacification air-fluid levels or mucosal thickening. No significant
bone abnormalities are seen.
IMPRESSION: Negative.

## 2022-09-22 ENCOUNTER — Other Ambulatory Visit: Payer: Self-pay

## 2022-09-22 ENCOUNTER — Ambulatory Visit
Admission: EM | Admit: 2022-09-22 | Discharge: 2022-09-22 | Disposition: A | Discharge status: Home / Self Care | Payer: Medicare Other | Attending: Family Medicine | Admitting: Family Medicine

## 2022-09-22 DIAGNOSIS — J309 Allergic rhinitis, unspecified: Secondary | ICD-10-CM

## 2022-09-22 DIAGNOSIS — J0100 Acute maxillary sinusitis, unspecified: Secondary | ICD-10-CM

## 2022-09-22 MED ORDER — FEXOFENADINE HCL 180 MG PO TABS: 180.0000 mg | ORAL_TABLET | Freq: Every day | ORAL | 0 refills | Status: DC

## 2022-09-22 MED ORDER — AMOXICILLIN-POT CLAVULANATE 875-125 MG PO TABS: 1.0000 | ORAL_TABLET | Freq: Two times a day (BID) | ORAL | 0 refills | Status: DC

## 2022-09-22 NOTE — Discharge Instructions (Addendum)
Patient to take medication as directed with food to completion.  Advised patient to take Allegra with first dose of Augmentin for the next 5 of 7 days.  May take Allegra as needed afterwards for concurrent postnasal drainage/drip.  Encouraged patient increase daily water intake while taking these medications.

## 2022-09-22 NOTE — ED Provider Notes (Signed)
[facility]    CSN: [id number] Arrival date & time: 09/22/22  1001      History   Chief Complaint Chief Complaint  Patient presents with   Nasal Congestion   Sore Throat    HPI Deanna Daugherty is a 66 y.o. female.   HPI   pleasant 66 year old female presents with cough, runny nose and sore throat for 10+ days.  Patient reports history of sinus infections.  Past Medical History:  Diagnosis Date   MS (multiple sclerosis) [facility])     Patient Active Problem List   Diagnosis Date Noted   MS (multiple sclerosis) (HCC)    CELLULITIS, ARM 08/26/2011    Past Surgical History:  Procedure Laterality Date   ABDOMINAL HYSTERECTOMY      OB History   No obstetric history on file.      Home Medications    Prior to Admission medications   Medication Sig Start Date End Date Taking? Authorizing Provider  amoxicillin-clavulanate (AUGMENTIN) 875-125 MG tablet Take 1 tablet by mouth every 12 (twelve) hours. 09/22/22  Yes Trevor Iha, FNP  fexofenadine [facility] ALLERGY) 180 MG tablet Take 1 tablet (180 mg total) by mouth daily for 15 days. 09/22/22 10/07/22 Yes Trevor Iha, FNP  acetaminophen (TYLENOL) 500 MG tablet Take 500 mg by mouth every 6 (six) hours as needed.    [provider]  aspirin 81 MG chewable tablet Chew by mouth.    [provider]    Family History Family History  Problem Relation Age of Onset   Cancer Mother    Cancer Father    Hypertension Sister    Heart defect Sister     Social History Social History   Tobacco Use   Smoking status: Never   Smokeless tobacco: Never  Substance Use Topics   Alcohol use: No   Drug use: No     Allergies   Ceclor [cefaclor], Ciprofloxacin, and Metronidazole   Review of Systems Review of Systems   Physical Exam Triage Vital Signs ED Triage Vitals  Enc Vitals Group     BP 09/22/22 1032 134/83     Pulse Rate 09/22/22 1032 76     Resp 09/22/22 1032 18     Temp 09/22/22 1032  98.7 F (37.1 C)     Temp Source 09/22/22 1032 Oral     SpO2 09/22/22 1032 97 %     Weight --      Height --      Head Circumference --      Peak Flow --      Pain Score 09/22/22 1033 8     Pain Loc --      Pain Edu? --      Excl. in GC? --    No data found.  Updated Vital Signs BP 134/83 (BP Location: Right Arm)   Pulse 76   Temp 98.7 F (37.1 C) (Oral)   Resp 18   SpO2 97%      Physical Exam Vitals and nursing note reviewed.  Constitutional:      Appearance: Normal appearance. She is obese. She is ill-appearing.  HENT:     Head: Normocephalic and atraumatic.     Right Ear: Tympanic membrane and external ear normal.     Left Ear: Tympanic membrane and external ear normal.     Ears:     Comments: Significant eustachian tube dysfunction noted bilaterally    Mouth/Throat:     Mouth: Mucous membranes are moist.  Pharynx: Oropharynx is clear.     Comments: Moderate amount of clear drainage of posterior oropharynx noted Eyes:     Extraocular Movements: Extraocular movements intact.     Conjunctiva/sclera: Conjunctivae normal.     Pupils: Pupils are equal, round, and reactive to light.  Cardiovascular:     Rate and Rhythm: Normal rate and regular rhythm.     Pulses: Normal pulses.     Heart sounds: Normal heart sounds. No murmur heard. Pulmonary:     Effort: Pulmonary effort is normal.     Breath sounds: Normal breath sounds. No wheezing, rhonchi or rales.  Musculoskeletal:     Cervical back: Normal range of motion and neck supple.  Skin:    General: Skin is warm and dry.  Neurological:     General: No focal deficit present.     Mental Status: She is alert and oriented to person, place, and time.      UC Treatments / Results  Labs (all labs ordered are listed, but only abnormal results are displayed) Labs Reviewed - No data to display  EKG   Radiology No results found.  Procedures Procedures (including critical care time)  Medications Ordered in  UC Medications - No data to display  Initial Impression / Assessment and Plan / UC Course  I have reviewed the triage vital signs and the nursing notes.  Pertinent labs & imaging results that were available during my care of the patient were reviewed by me and considered in my medical decision making (see chart for details).     MDM: 1.  Acute maxillary sinusitis, recurrence not specified-Rx'd Augmentin (reports taking Augmentin numerous times without adverse reactions); 2.  Allergic rhinitis-Rx'd Allegra. Patient to take medication as directed with food to completion.  Advised patient to take Allegra with first dose of Augmentin for the next 5 of 7 days.  May take Allegra as needed afterwards for concurrent postnasal drainage/drip.  Encouraged patient increase daily water intake while taking these medications. Final Clinical Impressions(s) / UC Diagnoses   Final diagnoses:  Acute maxillary sinusitis, recurrence not specified  Allergic rhinitis, unspecified seasonality, unspecified trigger     Discharge Instructions      Patient to take medication as directed with food to completion.  Advised patient to take Allegra with first dose of Augmentin for the next 5 of 7 days.  May take Allegra as needed afterwards for concurrent postnasal drainage/drip.  Encouraged patient increase daily water intake while taking these medications.     ED Prescriptions     Medication Sig Dispense Auth. Provider   amoxicillin-clavulanate (AUGMENTIN) 875-125 MG tablet Take 1 tablet by mouth every 12 (twelve) hours. 14 tablet Trevor Iha, FNP   fexofenadine [facility] ALLERGY) 180 MG tablet Take 1 tablet (180 mg total) by mouth daily for 15 days. 15 tablet Trevor Iha, FNP      PDMP not reviewed this encounter.   Trevor Iha, FNP 09/22/22 1137

## 2022-09-22 NOTE — ED Triage Notes (Signed)
Pt c/o runny nose and sore throat since last Thursday. Lots of post nasal drainage. Denies fever. Taking zyrtec, flonase prn. Hx of sinus infections.

## 2023-02-21 ENCOUNTER — Ambulatory Visit
Admission: EM | Admit: 2023-02-21 | Discharge: 2023-02-21 | Disposition: A | Discharge status: Home / Self Care | Payer: Medicare Other | Attending: Family Medicine | Admitting: Family Medicine

## 2023-02-21 ENCOUNTER — Ambulatory Visit (INDEPENDENT_AMBULATORY_CARE_PROVIDER_SITE_OTHER): Payer: Medicare Other

## 2023-02-21 DIAGNOSIS — R197 Diarrhea, unspecified: Secondary | ICD-10-CM | POA: Diagnosis not present

## 2023-02-21 DIAGNOSIS — E860 Dehydration: Secondary | ICD-10-CM

## 2023-02-21 DIAGNOSIS — R1110 Vomiting, unspecified: Secondary | ICD-10-CM

## 2023-02-21 DIAGNOSIS — K219 Gastro-esophageal reflux disease without esophagitis: Secondary | ICD-10-CM

## 2023-02-21 DIAGNOSIS — K222 Esophageal obstruction: Secondary | ICD-10-CM | POA: Diagnosis not present

## 2023-02-21 NOTE — ED Triage Notes (Signed)
Pt presents with c/o watery diarrhea and emesis that occurred last night. Pt state she has hx of constipation and regurgitation.

## 2023-02-21 NOTE — ED Provider Notes (Signed)
[facility]    CSN: [id number] Arrival date & time: 02/21/23  1248      History   Chief Complaint Chief Complaint  Patient presents with   Diarrhea   Emesis    HPI Deanna Daugherty is a 67 y.o. female.   HPI  Patient is here for nausea vomiting and diarrhea.  She does not, however, have a gastroenteritis.  She has difficulty swallowing from chronic reflux and esophageal stricture.  She states that when she tries to swallow even water it comes back up.  She is having difficulty getting her pills down.  She states the last meal she ate was several days ago.  She then vomited yesterday and saw parts of undigested food from that meal.  Since yesterday has also developed watery uncontrollable diarrhea.  She states that even if she sneezes she will have fecal incontinence.  Since she is unable to keep down a lot of water, she is feeling somewhat weak.  He has frequent burping and sometimes regurgitates fluid.  She states it is "yellow bile". She has a gastroenterologist and had a stricture that was dilated in 2022 She has a primary care doctor and she saw this doctor on 02/15/2023.  She was starting to have symptoms and was advised to call her gastroenterologist at that time. Her daughter brought her to the office today.  Past Medical History:  Diagnosis Date   MS (multiple sclerosis)     Patient Active Problem List   Diagnosis Date Noted   MS (multiple sclerosis)    CELLULITIS, ARM 08/26/2011    Past Surgical History:  Procedure Laterality Date   ABDOMINAL HYSTERECTOMY      OB History   No obstetric history on file.      Home Medications    Prior to Admission medications   Medication Sig Start Date End Date Taking? Authorizing Provider  atorvastatin (LIPITOR) 10 MG tablet Take 10 mg by mouth daily.   Yes [provider]  pantoprazole (PROTONIX) 40 MG tablet Take 40 mg by mouth daily.   Yes [provider]  acetaminophen (TYLENOL) 500 MG  tablet Take 500 mg by mouth every 6 (six) hours as needed.    [provider]  aspirin 81 MG chewable tablet Chew by mouth.    [provider]    Family History Family History  Problem Relation Age of Onset   Cancer Mother    Cancer Father    Hypertension Sister    Heart defect Sister     Social History Social History   Tobacco Use   Smoking status: Never   Smokeless tobacco: Never  Substance Use Topics   Alcohol use: No   Drug use: No     Allergies   Ceclor [cefaclor], Ciprofloxacin, and Metronidazole   Review of Systems Review of Systems See HPI  Physical Exam Triage Vital Signs ED Triage Vitals  Enc Vitals Group     BP 02/21/23 1258 (!) 144/85     Pulse Rate 02/21/23 1258 (!) 114     Resp 02/21/23 1258 14     Temp 02/21/23 1258 98.7 F (37.1 C)     Temp Source 02/21/23 1258 Oral     SpO2 02/21/23 1258 96 %     Weight --      Height --      Head Circumference --      Peak Flow --      Pain Score 02/21/23 1257 0  Pain Loc --      Pain Edu? --      Excl. in GC? --    No data found.  Updated Vital Signs BP 126/74 (BP Location: Left Arm)   Pulse (!) 109   Temp 98.7 F (37.1 C) (Oral)   Resp 14   SpO2 95%       Physical Exam Constitutional:      General: She is in acute distress.     Appearance: She is well-developed and normal weight.  HENT:     Head: Normocephalic and atraumatic.     Mouth/Throat:     Mouth: Mucous membranes are dry.     Pharynx: No posterior oropharyngeal erythema.  Eyes:     Conjunctiva/sclera: Conjunctivae normal.     Pupils: Pupils are equal, round, and reactive to light.  Cardiovascular:     Rate and Rhythm: Regular rhythm. Tachycardia present.     Heart sounds: Normal heart sounds.  Pulmonary:     Effort: Pulmonary effort is normal. No respiratory distress.     Breath sounds: Normal breath sounds.  Abdominal:     General: Abdomen is protuberant. Bowel sounds are decreased. There is no  distension.     Palpations: Abdomen is soft.     Tenderness: There is no abdominal tenderness.  Musculoskeletal:        General: Normal range of motion.     Cervical back: Normal range of motion.  Skin:    General: Skin is warm and dry.  Neurological:     Mental Status: She is alert.      UC Treatments / Results  Labs (all labs ordered are listed, but only abnormal results are displayed) Labs Reviewed - No data to display  EKG   Radiology DG Abdomen 1 View  Result Date: 02/21/2023 CLINICAL DATA:  Diarrhea and vomiting.  History of constipation. EXAM: ABDOMEN - 1 VIEW COMPARISON:  None Available. FINDINGS: Scattered gas and a small amount of stool are present in the colon. No dilated loops of bowel are seen to suggest obstruction. A 2 mm calcific density projects over the lower pole of the right kidney faint calcific density projecting over the lower pole of the left kidney is favored to be from the overlying twelfth rib. No acute osseous abnormality is seen. IMPRESSION: 1. Nonobstructed bowel-gas pattern. 2. Possible punctate right renal calculus. Electronically Signed   By: Sebastian Ache M.D.   On: 02/21/2023 14:11    Procedures Procedures (including critical care time)  Medications Ordered in UC Medications - No data to display  Initial Impression / Assessment and Plan / UC Course  I have reviewed the triage vital signs and the nursing notes.  Pertinent labs & imaging results that were available during my care of the patient were reviewed by me and considered in my medical decision making (see chart for details).     The x-ray does not show obstruction or any significant abnormality.  I have called the patient's PCP who believes the patient Troy Sine in the emergency room.  I did call the gastroenterology office if they were able to accommodate her.  They also recommend emergency room, if unable to tolerate discomfort until an appointment on Monday.  Discussed with patient and  her daughter.  They were sent to the emergency room for IV fluids and management Final Clinical Impressions(s) / UC Diagnoses   Final diagnoses:  Esophageal stricture  Gastroesophageal reflux disease, unspecified whether esophagitis present  Diarrhea, unspecified type  Dehydration, mild     Discharge Instructions      Go to ER     ED Prescriptions   None    PDMP not reviewed this encounter.   Eustace Moore, MD 02/21/23 [phone]

## 2023-02-21 NOTE — ED Notes (Signed)
Patient is being discharged from the Urgent Care and sent to the Emergency Department via POV . Per Lindaann Slough MD, patient is in need of higher level of care due to Emesis and diarrhea. Patient is aware and verbalizes understanding of plan of care.  Vitals:   02/21/23 1258 02/21/23 1340  BP: (!) 144/85 126/74  Pulse: (!) 114 (!) 109  Resp: 14 14  Temp: 98.7 F (37.1 C)   SpO2: 96% 95%

## 2023-02-21 NOTE — Discharge Instructions (Signed)
Go to ER

## 2023-03-27 ENCOUNTER — Ambulatory Visit
Admission: EM | Admit: 2023-03-27 | Discharge: 2023-03-27 | Disposition: A | Discharge status: Home / Self Care | Payer: Medicare Other | Attending: Family Medicine | Admitting: Family Medicine

## 2023-03-27 ENCOUNTER — Ambulatory Visit (INDEPENDENT_AMBULATORY_CARE_PROVIDER_SITE_OTHER): Payer: Medicare Other

## 2023-03-27 DIAGNOSIS — R059 Cough, unspecified: Secondary | ICD-10-CM

## 2023-03-27 DIAGNOSIS — J40 Bronchitis, not specified as acute or chronic: Secondary | ICD-10-CM | POA: Diagnosis not present

## 2023-03-27 DIAGNOSIS — J329 Chronic sinusitis, unspecified: Secondary | ICD-10-CM | POA: Diagnosis not present

## 2023-03-27 MED ORDER — DOXYCYCLINE HYCLATE 100 MG PO CAPS: 100.0000 mg | ORAL_CAPSULE | Freq: Two times a day (BID) | ORAL | 0 refills | Status: AC

## 2023-03-27 MED ORDER — PREDNISONE 10 MG (21) PO TBPK: ORAL_TABLET | Freq: Every day | ORAL | 0 refills | Status: DC

## 2023-03-27 MED ORDER — BENZONATATE 200 MG PO CAPS: 200.0000 mg | ORAL_CAPSULE | Freq: Three times a day (TID) | ORAL | 0 refills | Status: AC | PRN

## 2023-03-27 MED ORDER — PROMETHAZINE-DM 6.25-15 MG/5ML PO SYRP: 5.0000 mL | ORAL_SOLUTION | Freq: Two times a day (BID) | ORAL | 0 refills | Status: DC | PRN

## 2023-03-27 NOTE — ED Provider Notes (Signed)
[facility]    CSN: [id number] Arrival date & time: 03/27/23  0959      History   Chief Complaint Chief Complaint  Patient presents with   Nasal Congestion   Cough    HPI Deanna Daugherty is a 67 y.o. female.   HPI was not 67 year old female presents with sinus nasal congestion and cough for 2 weeks.  PMH significant for multiple sclerosis, obesity, and HLD  Past Medical History:  Diagnosis Date   MS (multiple sclerosis) (HCC)     Patient Active Problem List   Diagnosis Date Noted   MS (multiple sclerosis) (HCC)    CELLULITIS, ARM 08/26/2011    Past Surgical History:  Procedure Laterality Date   ABDOMINAL HYSTERECTOMY      OB History   No obstetric history on file.      Home Medications    Prior to Admission medications   Medication Sig Start Date End Date Taking? Authorizing Provider  benzonatate (TESSALON) 200 MG capsule Take 1 capsule (200 mg total) by mouth 3 (three) times daily as needed for up to 7 days. 03/27/23 04/03/23 Yes Trevor Iha, FNP  doxycycline (VIBRAMYCIN) 100 MG capsule Take 1 capsule (100 mg total) by mouth 2 (two) times daily for 10 days. 03/27/23 04/06/23 Yes Trevor Iha, FNP  predniSONE (STERAPRED UNI-PAK 21 TAB) 10 MG (21) TBPK tablet Take by mouth daily. Take 6 tabs by mouth daily  for 2 days, then 5 tabs for 2 days, then 4 tabs for 2 days, then 3 tabs for 2 days, 2 tabs for 2 days, then 1 tab by mouth daily for 2 days 03/27/23  Yes Trevor Iha, FNP  promethazine-dextromethorphan (PROMETHAZINE-DM) 6.25-15 MG/5ML syrup Take 5 mLs by mouth 2 (two) times daily as needed for cough. 03/27/23  Yes Trevor Iha, FNP  acetaminophen (TYLENOL) 500 MG tablet Take 500 mg by mouth every 6 (six) hours as needed.    [provider]  aspirin 81 MG chewable tablet Chew by mouth.    [provider]  atorvastatin (LIPITOR) 10 MG tablet Take 10 mg by mouth daily.    [provider]  pantoprazole (PROTONIX) 40 MG tablet  Take 40 mg by mouth daily.    [provider]    Family History Family History  Problem Relation Age of Onset   Cancer Mother    Cancer Father    Hypertension Sister    Heart defect Sister     Social History Social History   Tobacco Use   Smoking status: Never   Smokeless tobacco: Never  Substance Use Topics   Alcohol use: No   Drug use: No     Allergies   Ceclor [cefaclor], Ciprofloxacin, and Metronidazole   Review of Systems Review of Systems  HENT:  Positive for congestion, postnasal drip and sinus pain.   Respiratory:  Positive for cough.   All other systems reviewed and are negative.    Physical Exam Triage Vital Signs ED Triage Vitals  Enc Vitals Group     BP      Pulse      Resp      Temp      Temp src      SpO2      Weight      Height      Head Circumference      Peak Flow      Pain Score      Pain Loc      Pain  Edu?      Excl. in GC?    No data found.  Updated Vital Signs BP 133/86 (BP Location: Right Arm)   Pulse 93   Temp 98.4 F (36.9 C) (Oral)   Resp 16   SpO2 95%    Physical Exam Vitals and nursing note reviewed.  Constitutional:      Appearance: Normal appearance. She is obese. She is ill-appearing.  HENT:     Head: Normocephalic and atraumatic.     Right Ear: External ear normal.     Left Ear: External ear normal.     Ears:     Comments: TM's are cloudy, retracted bilaterally; moderate eustachian tube dysfunction noted bilaterally    Nose:     Comments: Turbinates are erythematous/edematous    Mouth/Throat:     Mouth: Mucous membranes are moist.     Pharynx: Oropharynx is clear.     Comments: Significant amount of clear drainage of posterior oropharynx noted Eyes:     Conjunctiva/sclera: Conjunctivae normal.     Pupils: Pupils are equal, round, and reactive to light.  Cardiovascular:     Rate and Rhythm: Normal rate and regular rhythm.     Pulses: Normal pulses.     Heart sounds: Normal heart sounds.   Pulmonary:     Effort: Pulmonary effort is normal.     Breath sounds: Rhonchi present. No wheezing or rales.     Comments: Mild diffuse scattered rhonchi noted throughout, infrequent nonproductive cough noted on exam Musculoskeletal:        General: Normal range of motion.     Cervical back: Normal range of motion and neck supple.  Skin:    General: Skin is warm and dry.  Neurological:     General: No focal deficit present.     Mental Status: She is alert and oriented to person, place, and time. Mental status is at baseline.  Psychiatric:        Mood and Affect: Mood normal.        Behavior: Behavior normal.      UC Treatments / Results  Labs (all labs ordered are listed, but only abnormal results are displayed) Labs Reviewed - No data to display  EKG   Radiology DG Chest 2 View  Result Date: 03/27/2023 CLINICAL DATA:  Cough over the last 2 weeks. Question pneumonia. Low-grade fever. EXAM: CHEST - 2 VIEW COMPARISON:  01/21/2016 FINDINGS: Heart size is normal. Mediastinal shadows are normal. There may be central bronchial thickening but there is no infiltrate, collapse or effusion. No significant bone finding. IMPRESSION: Possible bronchitis. No consolidation or collapse. Electronically Signed   By: Paulina Fusi M.D.   On: 03/27/2023 10:50    Procedures Procedures (including critical care time)  Medications Ordered in UC Medications - No data to display  Initial Impression / Assessment and Plan / UC Course  I have reviewed the triage vital signs and the nursing notes.  Pertinent labs & imaging results that were available during my care of the patient were reviewed by me and considered in my medical decision making (see chart for details).     Imp: 1.  Sinobronchitis-1. Rx'd Doxycycline 100 mg capsule twice daily x 10 days, Sterapred Unipak (tapering from 60 mg to 10 mg over 10 days); 2.  Cough-CXR revealed above, Rx'd Tessalon perles 3 times daily, as needed,  Promethazine DM 6.25-15 mg / 5 mL: Take 5 mL twice daily for cough, as needed. Instructed patient to discontinue OTC  Mucinex as this will only increase the volume of your nasal secretions/postnasal drip.  Advised patient to take medication as directed with food to completion.  Advised patient to take prednisone with first dose of Doxycycline for the next 10 days.  Advised may use Tessalon daily or as needed for cough.  Advised may use promethazine DM at night prior to sleep for cough due to sedate of effects.  Advised patient not to use cough medications together.  Encouraged increase daily water intake to 64 ounces per day while taking these medications.  Advised if symptoms worsen and/or unresolved please follow-up with PCP or here for further evaluation.  Patient discharged home, hemodynamically stable. Final Clinical Impressions(s) / UC Diagnoses   Final diagnoses:  Sinobronchitis  Cough, unspecified type     Discharge Instructions      Instructed patient to discontinue OTC Mucinex as this will only increase the volume of your nasal secretions/postnasal drip.  Advised patient to take medication as directed with food to completion.  Advised patient to take prednisone with first dose of Doxycycline for the next 10 days.  Advised may use Tessalon daily or as needed for cough.  Advised may use promethazine DM at night prior to sleep for cough due to sedate of effects.  Advised patient not to use cough medications together.  Encouraged increase daily water intake to 64 ounces per day while taking these medications.  Advised if symptoms worsen and/or unresolved please follow-up with PCP or here for further evaluation.     ED Prescriptions     Medication Sig Dispense Auth. Provider   doxycycline (VIBRAMYCIN) 100 MG capsule Take 1 capsule (100 mg total) by mouth 2 (two) times daily for 10 days. 20 capsule Trevor Iha, FNP   predniSONE (STERAPRED UNI-PAK 21 TAB) 10 MG (21) TBPK tablet Take by  mouth daily. Take 6 tabs by mouth daily  for 2 days, then 5 tabs for 2 days, then 4 tabs for 2 days, then 3 tabs for 2 days, 2 tabs for 2 days, then 1 tab by mouth daily for 2 days 42 tablet Trevor Iha, FNP   benzonatate (TESSALON) 200 MG capsule Take 1 capsule (200 mg total) by mouth 3 (three) times daily as needed for up to 7 days. 40 capsule Trevor Iha, FNP   promethazine-dextromethorphan (PROMETHAZINE-DM) 6.25-15 MG/5ML syrup Take 5 mLs by mouth 2 (two) times daily as needed for cough. 118 mL Trevor Iha, FNP      PDMP not reviewed this encounter.   Trevor Iha, FNP 03/27/23 1123

## 2023-03-27 NOTE — ED Triage Notes (Addendum)
Pt c/o nasal congestion x 2 weeks. Cough started last night. Low grade fever. Feeling winded. Hx of sinus infections and pneumonia.  Mucinex and inhaler prn. Also takes allergy meds daily.

## 2023-03-27 NOTE — Discharge Instructions (Addendum)
Instructed patient to discontinue OTC Mucinex as this will only increase the volume of your nasal secretions/postnasal drip.  Advised patient to take medication as directed with food to completion.  Advised patient to take prednisone with first dose of Doxycycline for the next 10 days.  Advised may use Tessalon daily or as needed for cough.  Advised may use promethazine DM at night prior to sleep for cough due to sedate of effects.  Advised patient not to use cough medications together.  Encouraged increase daily water intake to 64 ounces per day while taking these medications.  Advised if symptoms worsen and/or unresolved please follow-up with PCP or here for further evaluation.

## 2024-02-10 ENCOUNTER — Other Ambulatory Visit: Payer: Self-pay

## 2024-02-10 ENCOUNTER — Telehealth: Payer: Self-pay

## 2024-02-10 ENCOUNTER — Ambulatory Visit
Admission: EM | Admit: 2024-02-10 | Discharge: 2024-02-10 | Disposition: A | Discharge status: Home / Self Care | Attending: Family Medicine | Admitting: Family Medicine

## 2024-02-10 ENCOUNTER — Ambulatory Visit

## 2024-02-10 DIAGNOSIS — M79672 Pain in left foot: Secondary | ICD-10-CM

## 2024-02-10 DIAGNOSIS — M25572 Pain in left ankle and joints of left foot: Secondary | ICD-10-CM

## 2024-02-10 DIAGNOSIS — W109XXA Fall (on) (from) unspecified stairs and steps, initial encounter: Secondary | ICD-10-CM | POA: Diagnosis not present

## 2024-02-10 MED ORDER — HYDROCODONE-ACETAMINOPHEN 5-325 MG PO TABS: 1.0000 | ORAL_TABLET | Freq: Four times a day (QID) | ORAL | 0 refills | Status: DC | PRN

## 2024-02-10 NOTE — Telephone Encounter (Signed)
 Pt notified xray results negative per Dr. Delton See

## 2024-02-10 NOTE — ED Provider Notes (Signed)
 [facility]    CSN: [id number] Arrival date & time: 02/10/24  1710      History   Chief Complaint Chief Complaint  Patient presents with   Ankle Pain   Foot Pain    HPI Deanna Daugherty is a 68 y.o. female.   Patient does have impaired strength and balance, impaired vision because of MS.  She tripped going downstairs today on slick surface because of the rain.  She states that her left foot bent back and she sat on it.  She now cannot bear weight on the foot.  It swollen.  Can hardly move her ankle.  Cannot move her toes.  Denies hitting her head.  Denies other injury    Past Medical History:  Diagnosis Date   MS (multiple sclerosis) [facility])     Patient Active Problem List   Diagnosis Date Noted   MS (multiple sclerosis) (HCC)    CELLULITIS, ARM 08/26/2011    Past Surgical History:  Procedure Laterality Date   ABDOMINAL HYSTERECTOMY      OB History   No obstetric history on file.      Home Medications    Prior to Admission medications   Medication Sig Start Date End Date Taking? Authorizing Provider  HYDROcodone-acetaminophen (NORCO/VICODIN) 5-325 MG tablet Take 1-2 tablets by mouth every 6 (six) hours as needed. 02/10/24  Yes Eustace Moore, MD  acetaminophen (TYLENOL) 500 MG tablet Take 500 mg by mouth every 6 (six) hours as needed.    [provider]  aspirin 81 MG chewable tablet Chew by mouth.    [provider]  atorvastatin (LIPITOR) 10 MG tablet Take 10 mg by mouth daily.    [provider]    Family History Family History  Problem Relation Age of Onset   Cancer Mother    Cancer Father    Hypertension Sister    Heart defect Sister     Social History Social History   Tobacco Use   Smoking status: Never   Smokeless tobacco: Never  Substance Use Topics   Alcohol use: No   Drug use: No     Allergies   Ceclor [cefaclor], Ciprofloxacin, and Metronidazole   Review of Systems Review of Systems  See  HPI Physical Exam Triage Vital Signs ED Triage Vitals  Encounter Vitals Group     BP 02/10/24 1727 126/84     Systolic BP Percentile --      Diastolic BP Percentile --      Pulse Rate 02/10/24 1727 73     Resp 02/10/24 1727 16     Temp 02/10/24 1727 97.6 F (36.4 C)     Temp src --      SpO2 02/10/24 1727 97 %     Weight --      Height --      Head Circumference --      Peak Flow --      Pain Score 02/10/24 1731 9     Pain Loc --      Pain Education --      Exclude from Growth Chart --    No data found.  Updated Vital Signs BP 126/84   Pulse 73   Temp 97.6 F (36.4 C)   Resp 16   SpO2 97%       Physical Exam Constitutional:      General: She is not in acute distress.    Appearance: She is well-developed. She is obese.  HENT:  Head: Normocephalic and atraumatic.  Eyes:     Conjunctiva/sclera: Conjunctivae normal.     Pupils: Pupils are equal, round, and reactive to light.  Cardiovascular:     Rate and Rhythm: Normal rate.  Pulmonary:     Effort: Pulmonary effort is normal. No respiratory distress.  Abdominal:     General: There is no distension.     Palpations: Abdomen is soft.  Musculoskeletal:        General: Swelling, tenderness and signs of injury present. No deformity. Normal range of motion.     Cervical back: Normal range of motion.     Comments: Left foot and ankle has diffuse swelling.  There is tenderness over the medial malleolus.  Tenderness over the anterior ankle joint that is moderate.  Tenderness in the midfoot that is moderate.  Patient is unable to move her ankle secondary to pain, can flex and extend only a few degrees.  Can flex and extend toes only a few degrees.  States her foot feels "numb".  She can feel light touch.  Cap refill and pulses are good  Skin:    General: Skin is warm and dry.  Neurological:     Mental Status: She is alert.     Gait: Gait abnormal.      UC Treatments / Results  Labs (all labs ordered are listed,  but only abnormal results are displayed) Labs Reviewed - No data to display  EKG   Radiology DG Ankle Complete Left Result Date: 02/10/2024 CLINICAL DATA:  Foot and ankle pain after fall.  Bruising. EXAM: LEFT ANKLE COMPLETE - 3+ VIEW; LEFT FOOT - COMPLETE 3+ VIEW COMPARISON:  None Available. FINDINGS: Ankle: There is no evidence of fracture, dislocation, or joint effusion. The ankle mortise is preserved. Chronic appearing corticated density adjacent to the medial malleolus. Moderate plantar calcaneal spur and Achilles tendon enthesophyte. Foot: No evidence of fracture or dislocation. No erosive or bony destructive change. The alignment and joint spaces are preserved. Mild soft tissue edema. IMPRESSION: 1. Mild soft tissue edema. No acute fracture or dislocation of the left ankle or foot. 2. Moderate plantar calcaneal spur and Achilles tendon enthesophyte. Electronically Signed   By: Narda Rutherford M.D.   On: 02/10/2024 18:27   DG Foot Complete Left Result Date: 02/10/2024 CLINICAL DATA:  Foot and ankle pain after fall.  Bruising. EXAM: LEFT ANKLE COMPLETE - 3+ VIEW; LEFT FOOT - COMPLETE 3+ VIEW COMPARISON:  None Available. FINDINGS: Ankle: There is no evidence of fracture, dislocation, or joint effusion. The ankle mortise is preserved. Chronic appearing corticated density adjacent to the medial malleolus. Moderate plantar calcaneal spur and Achilles tendon enthesophyte. Foot: No evidence of fracture or dislocation. No erosive or bony destructive change. The alignment and joint spaces are preserved. Mild soft tissue edema. IMPRESSION: 1. Mild soft tissue edema. No acute fracture or dislocation of the left ankle or foot. 2. Moderate plantar calcaneal spur and Achilles tendon enthesophyte. Electronically Signed   By: Narda Rutherford M.D.   On: 02/10/2024 18:27    Procedures Procedures (including critical care time)  Medications Ordered in UC Medications - No data to display  Initial Impression /  Assessment and Plan / UC Course  I have reviewed the triage vital signs and the nursing notes.  Pertinent labs & imaging results that were available during my care of the patient were reviewed by me and considered in my medical decision making (see chart for details).     Final Clinical  Impressions(s) / UC Diagnoses   Final diagnoses:  Foot pain, left  Acute left ankle pain  Fall from stairs     Discharge Instructions      Ice Elevate Ibuprofen for pain Wear boot Take hydrocodone if pain is severe See an orthopedic next week   ED Prescriptions     Medication Sig Dispense Auth. Provider   HYDROcodone-acetaminophen (NORCO/VICODIN) 5-325 MG tablet Take 1-2 tablets by mouth every 6 (six) hours as needed. 15 tablet Eustace Moore, MD      I have reviewed the PDMP during this encounter.   Eustace Moore, MD 02/10/24 [phone]

## 2024-02-10 NOTE — Discharge Instructions (Signed)
 Ice Elevate Ibuprofen for pain Wear boot Take hydrocodone if pain is severe See an orthopedic next week

## 2024-02-10 NOTE — ED Triage Notes (Signed)
 Fell at 1240 today on steps, slipped on wet pollen. Has c/o left ankle/foot pain. Can't move toes. No otc meds.

## 2024-10-31 ENCOUNTER — Ambulatory Visit
Admission: EM | Admit: 2024-10-31 | Discharge: 2024-10-31 | Disposition: A | Discharge status: Home / Self Care | Attending: Family Medicine | Admitting: Family Medicine

## 2024-10-31 ENCOUNTER — Other Ambulatory Visit: Payer: Self-pay

## 2024-10-31 DIAGNOSIS — J019 Acute sinusitis, unspecified: Secondary | ICD-10-CM

## 2024-10-31 DIAGNOSIS — J209 Acute bronchitis, unspecified: Secondary | ICD-10-CM

## 2024-10-31 HISTORY — DX: Calculus of kidney: N20.0

## 2024-10-31 HISTORY — DX: Disorder of kidney and ureter, unspecified: N28.9

## 2024-10-31 MED ORDER — PREDNISONE 20 MG PO TABS: 20.0000 mg | ORAL_TABLET | Freq: Two times a day (BID) | ORAL | 0 refills | Status: AC

## 2024-10-31 MED ORDER — AMOXICILLIN-POT CLAVULANATE 875-125 MG PO TABS: 1.0000 | ORAL_TABLET | Freq: Two times a day (BID) | ORAL | 0 refills | Status: AC

## 2024-10-31 MED ORDER — HYDROCOD POLI-CHLORPHE POLI ER 10-8 MG/5ML PO SUER: 5.0000 mL | Freq: Two times a day (BID) | ORAL | 0 refills | Status: AC | PRN

## 2024-10-31 NOTE — ED Triage Notes (Signed)
 Patient reports that she has nasal congestion, sneezing, headache, and an intermittent cough with a thick yellow/green sputum x 7 days.  Patient states she has been taking Dimetapp and Robitussin.

## 2024-10-31 NOTE — Discharge Instructions (Signed)
 Take the antibiotic 2 times a day.  Take the antibiotic with food Make sure you are drinking lots of fluids Take prednisone  once or twice a day as tolerated I have prescribed a stronger cough medicine to help.  Take twice a day.  May cause drowsiness. See your doctor if not improving by next week
